# Patient Record
Sex: Female | Born: 1980 | Race: Black or African American | Hispanic: No | Marital: Married | Smoking: Current every day smoker
Health system: Southern US, Community
[De-identification: ages and names within clinical notes are randomized; demographics above are authoritative.]

## PROBLEM LIST (undated history)

## (undated) DIAGNOSIS — T8859XA Other complications of anesthesia, initial encounter: Secondary | ICD-10-CM

## (undated) DIAGNOSIS — M549 Dorsalgia, unspecified: Secondary | ICD-10-CM

## (undated) DIAGNOSIS — F329 Major depressive disorder, single episode, unspecified: Secondary | ICD-10-CM

## (undated) DIAGNOSIS — F4310 Post-traumatic stress disorder, unspecified: Secondary | ICD-10-CM

## (undated) DIAGNOSIS — Z5189 Encounter for other specified aftercare: Secondary | ICD-10-CM

## (undated) DIAGNOSIS — T4145XA Adverse effect of unspecified anesthetic, initial encounter: Secondary | ICD-10-CM

## (undated) DIAGNOSIS — F32A Depression, unspecified: Secondary | ICD-10-CM

## (undated) HISTORY — PX: TUBAL LIGATION: SHX77

## (undated) HISTORY — PX: ANTERIOR CRUCIATE LIGAMENT REPAIR: SHX115

## (undated) HISTORY — PX: TONSILLECTOMY: SUR1361

## (undated) HISTORY — PX: FOOT SURGERY: SHX648

## (undated) HISTORY — PX: CYSTECTOMY: SUR359

## (undated) HISTORY — PX: DILATION AND CURETTAGE OF UTERUS: SHX78

---

## 1999-08-15 ENCOUNTER — Other Ambulatory Visit: Admission: RE | Admit: 1999-08-15 | Discharge: 1999-08-15 | Payer: Self-pay | Admitting: Family Medicine

## 2000-04-28 ENCOUNTER — Encounter: Payer: Self-pay | Admitting: Family Medicine

## 2000-04-28 ENCOUNTER — Ambulatory Visit (HOSPITAL_COMMUNITY): Admission: RE | Admit: 2000-04-28 | Discharge: 2000-04-28 | Payer: Self-pay | Admitting: Family Medicine

## 2001-06-10 ENCOUNTER — Ambulatory Visit (HOSPITAL_BASED_OUTPATIENT_CLINIC_OR_DEPARTMENT_OTHER): Admission: RE | Admit: 2001-06-10 | Discharge: 2001-06-10 | Payer: Self-pay | Admitting: Otolaryngology

## 2005-12-01 DIAGNOSIS — Z5189 Encounter for other specified aftercare: Secondary | ICD-10-CM

## 2005-12-01 HISTORY — DX: Encounter for other specified aftercare: Z51.89

## 2012-02-29 ENCOUNTER — Emergency Department (HOSPITAL_BASED_OUTPATIENT_CLINIC_OR_DEPARTMENT_OTHER)
Admission: EM | Admit: 2012-02-29 | Discharge: 2012-02-29 | Disposition: A | Discharge status: Home / Self Care | Payer: Medicaid Other | Attending: Emergency Medicine | Admitting: Emergency Medicine

## 2012-02-29 ENCOUNTER — Emergency Department (INDEPENDENT_AMBULATORY_CARE_PROVIDER_SITE_OTHER): Payer: Medicaid Other

## 2012-02-29 ENCOUNTER — Encounter (HOSPITAL_BASED_OUTPATIENT_CLINIC_OR_DEPARTMENT_OTHER): Payer: Self-pay | Admitting: *Deleted

## 2012-02-29 DIAGNOSIS — R0789 Other chest pain: Secondary | ICD-10-CM

## 2012-02-29 DIAGNOSIS — S298XXA Other specified injuries of thorax, initial encounter: Secondary | ICD-10-CM | POA: Insufficient documentation

## 2012-02-29 DIAGNOSIS — R079 Chest pain, unspecified: Secondary | ICD-10-CM

## 2012-02-29 DIAGNOSIS — X58XXXA Exposure to other specified factors, initial encounter: Secondary | ICD-10-CM | POA: Insufficient documentation

## 2012-02-29 DIAGNOSIS — I10 Essential (primary) hypertension: Secondary | ICD-10-CM | POA: Insufficient documentation

## 2012-02-29 LAB — PREGNANCY, URINE: Preg Test, Ur: NEGATIVE

## 2012-02-29 MED ORDER — OXYCODONE-ACETAMINOPHEN 5-325 MG PO TABS: 1.0000 | ORAL_TABLET | Freq: Four times a day (QID) | ORAL | Status: AC | PRN

## 2012-02-29 MED ORDER — NAPROXEN 500 MG PO TABS: 500.0000 mg | ORAL_TABLET | Freq: Two times a day (BID) | ORAL | Status: AC

## 2012-02-29 MED ORDER — IBUPROFEN 800 MG PO TABS
800.0000 mg | ORAL_TABLET | Freq: Once | ORAL | Status: AC
  Administered 2012-02-29: 800 mg via ORAL
  Filled 2012-02-29: qty 1

## 2012-02-29 NOTE — ED Notes (Signed)
MD at bedside. 
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
## 2012-02-29 NOTE — Discharge Instructions (Signed)
Chest Wall Pain Chest wall pain is pain in or around the bones and muscles of your chest. It may take up to 6 weeks to get better. It may take longer if you must stay physically active in your work and activities.  CAUSES  Chest wall pain may happen on its own. However, it may be caused by:  A viral illness like the flu.   Injury.   Coughing.   Exercise.   Arthritis.   Fibromyalgia.   Shingles.  HOME CARE INSTRUCTIONS   Avoid overtiring physical activity. Try not to strain or perform activities that cause pain. This includes any activities using your chest or your abdominal and side muscles, especially if heavy weights are used.   Put ice on the sore area.   Put ice in a plastic bag.   Place a towel between your skin and the bag.   Leave the ice on for 15 to 20 minutes per hour while awake for the first 2 days.   Only take over-the-counter or prescription medicines for pain, discomfort, or fever as directed by your caregiver.  SEEK IMMEDIATE MEDICAL CARE IF:   Your pain increases, or you are very uncomfortable.   You have a fever.   Your chest pain becomes worse.   You have new, unexplained symptoms.   You have nausea or vomiting.   You feel sweaty or lightheaded.   You have a cough with phlegm (sputum), or you cough up blood.  MAKE SURE YOU:   Understand these instructions.   Will watch your condition.   Will get help right away if you are not doing well or get worse.  Document Released: 11/17/2005 Document Revised: 11/06/2011 Document Reviewed: 07/14/2011 Exit[employer] Patient Information 2012 [employer]. 
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
## 2012-12-01 NOTE — L&D Delivery Note (Signed)
Delivery Note  Patient progressed to complete dilation well.  At 11:26 PM a viable female was delivered via Vaginal, Spontaneous Delivery (Presentation: Middle Occiput Posterior).  APGAR: 8, 9; weight pending.   Placenta status: Intact, Spontaneous.  Cord: 3 vessels with the following complications: Short.  Cord pH: n/a  Anesthesia: Epidural  Episiotomy: None Lacerations: none Suture Repair: n/a Est. Blood Loss (mL): 250  Mom to postpartum.  Baby to nursery-stable.  Anna Owens 07/17/2013, 11:45 PM    I was present for the delivery and agree with above Owens,Anna Raisch 07/17/2013 11:51 PM

## 2013-01-24 LAB — OB RESULTS CONSOLE ABO/RH: RH Type: POSITIVE

## 2013-01-24 LAB — OB RESULTS CONSOLE HEPATITIS B SURFACE ANTIGEN: Hepatitis B Surface Ag: NEGATIVE

## 2013-01-24 LAB — OB RESULTS CONSOLE HIV ANTIBODY (ROUTINE TESTING): HIV: NONREACTIVE

## 2013-01-24 LAB — OB RESULTS CONSOLE RUBELLA ANTIBODY, IGM: Rubella: IMMUNE

## 2013-07-17 ENCOUNTER — Inpatient Hospital Stay (HOSPITAL_COMMUNITY): Payer: Medicaid Other | Admitting: Anesthesiology

## 2013-07-17 ENCOUNTER — Inpatient Hospital Stay (HOSPITAL_COMMUNITY)
Admission: AD | Admit: 2013-07-17 | Discharge: 2013-07-19 | DRG: 775 | Disposition: A | Discharge status: Home / Self Care | Payer: Medicaid Other | Source: Ambulatory Visit | Attending: Obstetrics & Gynecology | Admitting: Obstetrics & Gynecology

## 2013-07-17 ENCOUNTER — Encounter (HOSPITAL_COMMUNITY): Payer: Self-pay | Admitting: *Deleted

## 2013-07-17 ENCOUNTER — Encounter (HOSPITAL_COMMUNITY): Payer: Self-pay | Admitting: Anesthesiology

## 2013-07-17 DIAGNOSIS — O4290 Premature rupture of membranes, unspecified as to length of time between rupture and onset of labor, unspecified weeks of gestation: Secondary | ICD-10-CM

## 2013-07-17 DIAGNOSIS — O09529 Supervision of elderly multigravida, unspecified trimester: Secondary | ICD-10-CM | POA: Diagnosis present

## 2013-07-17 HISTORY — DX: Other complications of anesthesia, initial encounter: T88.59XA

## 2013-07-17 HISTORY — DX: Major depressive disorder, single episode, unspecified: F32.9

## 2013-07-17 HISTORY — DX: Adverse effect of unspecified anesthetic, initial encounter: T41.45XA

## 2013-07-17 HISTORY — DX: Depression, unspecified: F32.A

## 2013-07-17 HISTORY — DX: Encounter for other specified aftercare: Z51.89

## 2013-07-17 LAB — CBC
HCT: 35.1 % — ABNORMAL LOW (ref 36.0–46.0)
Hemoglobin: 11.2 g/dL — ABNORMAL LOW (ref 12.0–15.0)
MCH: 24.8 pg — ABNORMAL LOW (ref 26.0–34.0)
MCHC: 31.9 g/dL (ref 30.0–36.0)

## 2013-07-17 LAB — RPR: RPR Ser Ql: NONREACTIVE

## 2013-07-17 MED ORDER — ACETAMINOPHEN 325 MG PO TABS: 650.0000 mg | ORAL_TABLET | ORAL | Status: DC | PRN

## 2013-07-17 MED ORDER — EPHEDRINE 5 MG/ML INJ: 10.0000 mg | INTRAVENOUS | Status: DC | PRN

## 2013-07-17 MED ORDER — BENZOCAINE-MENTHOL 20-0.5 % EX AERO
1.0000 "application " | INHALATION_SPRAY | CUTANEOUS | Status: DC | PRN
  Administered 2013-07-18: 1 via TOPICAL
  Filled 2013-07-17 (×2): qty 56

## 2013-07-17 MED ORDER — FENTANYL CITRATE 0.05 MG/ML IJ SOLN
100.0000 ug | INTRAMUSCULAR | Status: DC | PRN
  Administered 2013-07-17: 100 ug via INTRAVENOUS
  Filled 2013-07-17: qty 2

## 2013-07-17 MED ORDER — OXYTOCIN 40 UNITS IN LACTATED RINGERS INFUSION - SIMPLE MED: 62.5000 mL/h | INTRAVENOUS | Status: DC

## 2013-07-17 MED ORDER — BISACODYL 10 MG RE SUPP
10.0000 mg | Freq: Every day | RECTAL | Status: DC | PRN
  Filled 2013-07-17: qty 1

## 2013-07-17 MED ORDER — DIBUCAINE 1 % RE OINT
1.0000 "application " | TOPICAL_OINTMENT | RECTAL | Status: DC | PRN
  Filled 2013-07-17: qty 28

## 2013-07-17 MED ORDER — FLEET ENEMA 7-19 GM/118ML RE ENEM: 1.0000 | ENEMA | RECTAL | Status: DC | PRN

## 2013-07-17 MED ORDER — OXYCODONE-ACETAMINOPHEN 5-325 MG PO TABS
1.0000 | ORAL_TABLET | ORAL | Status: DC | PRN
  Administered 2013-07-18 – 2013-07-19 (×4): 1 via ORAL
  Filled 2013-07-17 (×4): qty 1

## 2013-07-17 MED ORDER — OXYTOCIN 40 UNITS IN LACTATED RINGERS INFUSION - SIMPLE MED: 62.5000 mL/h | INTRAVENOUS | Status: DC | PRN

## 2013-07-17 MED ORDER — MEASLES, MUMPS & RUBELLA VAC ~~LOC~~ INJ: 0.5000 mL | INJECTION | Freq: Once | SUBCUTANEOUS | Status: DC

## 2013-07-17 MED ORDER — PHENYLEPHRINE 40 MCG/ML (10ML) SYRINGE FOR IV PUSH (FOR BLOOD PRESSURE SUPPORT): 80.0000 ug | PREFILLED_SYRINGE | INTRAVENOUS | Status: DC | PRN

## 2013-07-17 MED ORDER — OXYCODONE-ACETAMINOPHEN 5-325 MG PO TABS: 1.0000 | ORAL_TABLET | ORAL | Status: DC | PRN

## 2013-07-17 MED ORDER — DIPHENHYDRAMINE HCL 50 MG/ML IJ SOLN: 12.5000 mg | INTRAMUSCULAR | Status: DC | PRN

## 2013-07-17 MED ORDER — TERBUTALINE SULFATE 1 MG/ML IJ SOLN: 0.2500 mg | Freq: Once | INTRAMUSCULAR | Status: DC | PRN

## 2013-07-17 MED ORDER — PHENYLEPHRINE 40 MCG/ML (10ML) SYRINGE FOR IV PUSH (FOR BLOOD PRESSURE SUPPORT)
80.0000 ug | PREFILLED_SYRINGE | INTRAVENOUS | Status: DC | PRN
  Filled 2013-07-17: qty 5

## 2013-07-17 MED ORDER — IBUPROFEN 600 MG PO TABS: 600.0000 mg | ORAL_TABLET | Freq: Four times a day (QID) | ORAL | Status: DC | PRN

## 2013-07-17 MED ORDER — ONDANSETRON HCL 4 MG/2ML IJ SOLN: 4.0000 mg | INTRAMUSCULAR | Status: DC | PRN

## 2013-07-17 MED ORDER — CITRIC ACID-SODIUM CITRATE 334-500 MG/5ML PO SOLN
30.0000 mL | ORAL | Status: DC | PRN
  Administered 2013-07-17 (×2): 30 mL via ORAL
  Filled 2013-07-17 (×2): qty 15

## 2013-07-17 MED ORDER — OXYTOCIN 40 UNITS IN LACTATED RINGERS INFUSION - SIMPLE MED
1.0000 m[IU]/min | INTRAVENOUS | Status: DC
  Administered 2013-07-17: 2 m[IU]/min via INTRAVENOUS
  Filled 2013-07-17: qty 1000

## 2013-07-17 MED ORDER — LACTATED RINGERS IV SOLN
500.0000 mL | Freq: Once | INTRAVENOUS | Status: AC
  Administered 2013-07-17: 500 mL via INTRAVENOUS

## 2013-07-17 MED ORDER — FENTANYL 2.5 MCG/ML BUPIVACAINE 1/10 % EPIDURAL INFUSION (WH - ANES)
14.0000 mL/h | INTRAMUSCULAR | Status: DC | PRN
  Administered 2013-07-17: 14 mL/h via EPIDURAL
  Filled 2013-07-17 (×2): qty 125

## 2013-07-17 MED ORDER — ZOLPIDEM TARTRATE 5 MG PO TABS: 5.0000 mg | ORAL_TABLET | Freq: Every evening | ORAL | Status: DC | PRN

## 2013-07-17 MED ORDER — SENNOSIDES-DOCUSATE SODIUM 8.6-50 MG PO TABS
2.0000 | ORAL_TABLET | Freq: Every day | ORAL | Status: DC
  Administered 2013-07-18: 2 via ORAL

## 2013-07-17 MED ORDER — OXYTOCIN BOLUS FROM INFUSION: 500.0000 mL | INTRAVENOUS | Status: DC

## 2013-07-17 MED ORDER — IBUPROFEN 600 MG PO TABS
600.0000 mg | ORAL_TABLET | Freq: Four times a day (QID) | ORAL | Status: DC
  Administered 2013-07-18 – 2013-07-19 (×7): 600 mg via ORAL
  Filled 2013-07-17 (×7): qty 1

## 2013-07-17 MED ORDER — EPHEDRINE 5 MG/ML INJ
10.0000 mg | INTRAVENOUS | Status: DC | PRN
  Filled 2013-07-17: qty 4

## 2013-07-17 MED ORDER — LIDOCAINE HCL (PF) 1 % IJ SOLN
30.0000 mL | INTRAMUSCULAR | Status: DC | PRN
  Filled 2013-07-17: qty 30

## 2013-07-17 MED ORDER — FERROUS SULFATE 325 (65 FE) MG PO TABS
325.0000 mg | ORAL_TABLET | Freq: Two times a day (BID) | ORAL | Status: DC
  Administered 2013-07-18 – 2013-07-19 (×4): 325 mg via ORAL
  Filled 2013-07-17 (×5): qty 1

## 2013-07-17 MED ORDER — SIMETHICONE 80 MG PO CHEW: 80.0000 mg | CHEWABLE_TABLET | ORAL | Status: DC | PRN

## 2013-07-17 MED ORDER — PRENATAL MULTIVITAMIN CH
1.0000 | ORAL_TABLET | Freq: Every day | ORAL | Status: DC
  Administered 2013-07-18 – 2013-07-19 (×2): 1 via ORAL
  Filled 2013-07-17 (×2): qty 1

## 2013-07-17 MED ORDER — LIDOCAINE HCL (PF) 1 % IJ SOLN
INTRAMUSCULAR | Status: DC | PRN
  Administered 2013-07-17 (×2): 3 mL

## 2013-07-17 MED ORDER — ONDANSETRON HCL 4 MG/2ML IJ SOLN
4.0000 mg | Freq: Four times a day (QID) | INTRAMUSCULAR | Status: DC | PRN
  Administered 2013-07-17: 4 mg via INTRAVENOUS
  Filled 2013-07-17: qty 2

## 2013-07-17 MED ORDER — LACTATED RINGERS IV SOLN
INTRAVENOUS | Status: DC
  Administered 2013-07-17 (×3): via INTRAVENOUS

## 2013-07-17 MED ORDER — LACTATED RINGERS IV SOLN
500.0000 mL | INTRAVENOUS | Status: DC | PRN
  Administered 2013-07-17: 500 mL via INTRAVENOUS

## 2013-07-17 MED ORDER — DIPHENHYDRAMINE HCL 25 MG PO CAPS: 25.0000 mg | ORAL_CAPSULE | Freq: Four times a day (QID) | ORAL | Status: DC | PRN

## 2013-07-17 MED ORDER — WITCH HAZEL-GLYCERIN EX PADS: 1.0000 "application " | MEDICATED_PAD | CUTANEOUS | Status: DC | PRN

## 2013-07-17 MED ORDER — HYDROXYZINE HCL 50 MG PO TABS: 50.0000 mg | ORAL_TABLET | Freq: Four times a day (QID) | ORAL | Status: DC | PRN

## 2013-07-17 MED ORDER — LANOLIN HYDROUS EX OINT: TOPICAL_OINTMENT | CUTANEOUS | Status: DC | PRN

## 2013-07-17 MED ORDER — FLEET ENEMA 7-19 GM/118ML RE ENEM: 1.0000 | ENEMA | Freq: Every day | RECTAL | Status: DC | PRN

## 2013-07-17 MED ORDER — METHYLERGONOVINE MALEATE 0.2 MG PO TABS: 0.2000 mg | ORAL_TABLET | ORAL | Status: DC | PRN

## 2013-07-17 MED ORDER — METHYLERGONOVINE MALEATE 0.2 MG/ML IJ SOLN: 0.2000 mg | INTRAMUSCULAR | Status: DC | PRN

## 2013-07-17 MED ORDER — OXYTOCIN BOLUS FROM INFUSION
500.0000 mL | INTRAVENOUS | Status: DC
  Administered 2013-07-17: 500 mL via INTRAVENOUS

## 2013-07-17 MED ORDER — FENTANYL 2.5 MCG/ML BUPIVACAINE 1/10 % EPIDURAL INFUSION (WH - ANES)
INTRAMUSCULAR | Status: DC | PRN
  Administered 2013-07-17: 14 mL/h via EPIDURAL

## 2013-07-17 MED ORDER — TETANUS-DIPHTH-ACELL PERTUSSIS 5-2.5-18.5 LF-MCG/0.5 IM SUSP: 0.5000 mL | Freq: Once | INTRAMUSCULAR | Status: DC

## 2013-07-17 MED ORDER — ONDANSETRON HCL 4 MG PO TABS: 4.0000 mg | ORAL_TABLET | ORAL | Status: DC | PRN

## 2013-07-17 NOTE — Anesthesia Preprocedure Evaluation (Addendum)
Anesthesia Evaluation  Patient identified by MRN, date of birth, ID band Patient awake    Reviewed: Allergy & Precautions, H&P , NPO status , Patient's Chart, lab work & pertinent test results  History of Anesthesia Complications Negative for: history of anesthetic complications  Airway Mallampati: II TM Distance: >3 FB Neck ROM: Full    Dental   Pulmonary neg pulmonary ROS,  breath sounds clear to auscultation        Cardiovascular negative cardio ROS  Rhythm:Regular     Neuro/Psych PSYCHIATRIC DISORDERS Depression negative neurological ROS     GI/Hepatic negative GI ROS, Neg liver ROS,   Endo/Other  negative endocrine ROS  Renal/GU negative Renal ROS     Musculoskeletal negative musculoskeletal ROS (+)   Abdominal   Peds  Hematology negative hematology ROS (+)   Anesthesia Other Findings   Reproductive/Obstetrics (+) Pregnancy                          Anesthesia Physical Anesthesia Plan  ASA: II  Anesthesia Plan: Epidural   Post-op Pain Management:    Induction:   Airway Management Planned:   Additional Equipment:   Intra-op Plan:   Post-operative Plan:   Informed Consent: I have reviewed the patients History and Physical, chart, labs and discussed the procedure including the risks, benefits and alternatives for the proposed anesthesia with the patient or authorized representative who has indicated his/her understanding and acceptance.     Plan Discussed with:   Anesthesia Plan Comments:         Anesthesia Quick Evaluation

## 2013-07-17 NOTE — Progress Notes (Signed)
Patient ID: Anna Owens, female   DOB: 1981-06-12, 32 y.o.   MRN: [id number] Anna Owens is a 32 y.o. [id number] at [redacted]w[redacted]d  admitted for rupture of membranes  Subjective:  Pt hurting and now s/p epidural. Now more comfortable. +FM.    Objective: BP 114/73  Pulse 71  Temp(Src) 98.1 F (36.7 C) (Oral)  Resp 18  Ht 5\' 5"  (1.651 m)  Wt 74.844 kg (165 lb)  BMI 27.46 kg/m2  SpO2 100%      FHT:  FHR: 130 bpm, variability: moderate,  accelerations:  Present,  decelerations:  Absent UC:   irregular, every 2-5 minutes SVE:   Dilation: 4 Effacement (%): 90 Station: -1 Exam by:: Gazella Anglin  Labs: Lab Results  Component Value Date   WBC 6.5 07/17/2013   HGB 11.2* 07/17/2013   HCT 35.1* 07/17/2013   MCV 77.8* 07/17/2013   PLT 244 07/17/2013    Assessment / Plan: admitted for SROM and now augmentation with pitocin  Labor: progressing well. now on 35mu/min. No change in SVE but irregular contractions and now hurting more. Will plan to cont increasing pit and recheck in 2 hours. If no change, will place IUPC. Fetal Wellbeing:  Category I Pain Control:  Epidural I/D:  n/a Anticipated MOD:  NSVD  Charrise Lardner L 07/17/2013, 1:31 PM

## 2013-07-17 NOTE — Progress Notes (Signed)
Patient ID: Anna Owens, female   DOB: 03-Nov-1981, 32 y.o.   MRN: [id number] Patient ID: Anna Owens, female   DOB: 03-25-1981, 32 y.o.   MRN: [id number] Anna Owens is a 32 y.o. [id number] at [redacted]w[redacted]d  admitted for rupture of membranes  Subjective:  Pt comfortable with epidural. . +FM.    Objective: BP 102/65  Pulse 84  Temp(Src) 97.8 F (36.6 C) (Oral)  Resp 18  Ht 5\' 5"  (1.651 m)  Wt 74.844 kg (165 lb)  BMI 27.46 kg/m2  SpO2 100%      FHT:  FHR: 140 bpm, variability: moderate,  accelerations:  Present,  decelerations:  Absent UC:   irregular, every 2-5 minutes SVE:   Dilation: 5.5 Effacement (%): 90 Station: -1 Exam by:: Kaushal Vannice  Labs: Lab Results  Component Value Date   WBC 6.5 07/17/2013   HGB 11.2* 07/17/2013   HCT 35.1* 07/17/2013   MCV 77.8* 07/17/2013   PLT 244 07/17/2013    Assessment / Plan: admitted for SROM and now augmentation with pitocin  Labor: progressing well. was on 14 mu/min and then stopped due to cat II tracing with variable lates. SVE now changed. Will restart pit also now that cat I tracing.   Fetal Wellbeing:  Category I and cat II earlier Pain Control:  Epidural I/D:  n/a Anticipated MOD:  NSVD  Anna Owens 07/17/2013, 4:05 PM

## 2013-07-17 NOTE — H&P (Signed)
Anna Owens is a 33 y.o. [phone] female at [redacted]w[redacted]d presenting for report of LOF since 39.  Reports good fm, denies vb or uc's. Prenatal care in [facility], states she thought she could just come here for delivery. Unsure if she had genetic screening, anatomy u/s and glucola normal per pt and states gbs neg. Denies complications during pregnancy.  H/O uncomplicated term SVD x 2 in [place], both 6+lbs.   Maternal Medical History:  Reason for admission: Rupture of membranes.   Contractions: Doesn't perceive uc's  Fetal activity: Perceived fetal activity is normal.   Last perceived fetal movement was within the past hour.    Prenatal complications: no prenatal complications Per pt  Prenatal Complications - Diabetes: none.    OB History   Grav Para Term Preterm Abortions TAB SAB Ect Mult Living   9 2 1 1 6 3 3   2      Past Medical History  Diagnosis Date  . Depression     no meds  . Blood transfusion without reported diagnosis 2007    SAB  . Complication of anesthesia     "difficult waking up"   Past Surgical History  Procedure Laterality Date  . Foot surgery    . Tonsillectomy    . Dilation and curettage of uterus    . Cystectomy     Family History: family history is not on file. Social History:  reports that she has never smoked. She does not have any smokeless tobacco history on file. She reports that she does not drink alcohol or use illicit drugs.   Review of Systems  Constitutional: Negative.   HENT: Negative.   Eyes: Negative.   Respiratory: Negative.   Cardiovascular: Negative.   Gastrointestinal: Negative.   Genitourinary: Negative.   Musculoskeletal: Negative.   Skin: Negative.   Neurological: Negative.   Endo/Heme/Allergies: Negative.   Psychiatric/Behavioral: Negative.     Dilation: 3 Effacement (%): 70 Station: -2 Exam by:: Joellyn Haff CNM Blood pressure 116/72, pulse 107, temperature 98 F (36.7 C), temperature source Oral, resp. rate 20,  height 5\' 5"  (1.651 m), weight 74.844 kg (165 lb). Maternal Exam:  Uterine Assessment: Contraction strength is mild.  Contraction frequency is regular.   Abdomen: Fetal presentation: vertex  Introitus: Normal vulva. Normal vagina.  Ferning test: not done.  Amniotic fluid character: clear. Grossly ruptured  Pelvis: adequate for delivery.   Cervix: Cervix evaluated by digital exam.     Fetal Exam Fetal Monitor Review: Mode: ultrasound.   Baseline rate: 140.  Variability: moderate (6-25 bpm).   Pattern: accelerations present and no decelerations.    Fetal State Assessment: Category I - tracings are normal.     Physical Exam  Constitutional: She is oriented to person, place, and time. She appears well-developed and well-nourished.  HENT:  Head: Normocephalic.  Neck: Normal range of motion.  Cardiovascular: Normal rate and regular rhythm.   Respiratory: Effort normal and breath sounds normal.  GI: Soft. There is no tenderness.  gravid  Genitourinary:  Grossly ruptured, clear fluid SVE: 3/70/-2, vtx  Musculoskeletal: Normal range of motion.  Neurological: She is alert and oriented to person, place, and time. She has normal reflexes.  Skin: Skin is warm and dry.  Psychiatric: She has a normal mood and affect. Her behavior is normal. Judgment and thought content normal.    Prenatal labs: waiting on prenatal records to be faxed ABO, Rh:   Antibody:   Rubella:   RPR:  HBsAg:    HIV:    GBS:     Assessment/Plan: A:  [redacted]w[redacted]d SIUP  [id number]   PROM @ 1900  PNC in [place]  GBS neg per pt  Cat I FHR  P:  Admit to BS  IV pain meds/epidural prn  Expectant management   Anticipate NSVD  Awaiting prenatal records from [place]     Marge Duncans 07/17/2013, 4:35 AM

## 2013-07-17 NOTE — Progress Notes (Signed)
Anna Owens is a 32 y.o. [phone] at [redacted]w[redacted]d  admitted for rupture of membranes  Subjective:  Pt feeling contractions over the last hour or so. +FM.    Objective: BP 125/79  Pulse 72  Temp(Src) 98.1 F (36.7 C) (Oral)  Resp 18  Ht 5\' 5"  (1.651 m)  Wt 74.844 kg (165 lb)  BMI 27.46 kg/m2      FHT:  FHR: 135 bpm, variability: periods of minimal with periods of moderate,  accelerations:  Present,  decelerations:  Absent UC:   regular, every 2-4 minutes SVE:   Dilation: 4 Effacement (%): 90 Station: -2 Exam by:: Nabeeha Badertscher  Labs: Lab Results  Component Value Date   WBC 6.5 07/17/2013   HGB 11.2* 07/17/2013   HCT 35.1* 07/17/2013   MCV 77.8* 07/17/2013   PLT 244 07/17/2013    Assessment / Plan: admitted for SROM and now augmentation with pitocin  Labor: progressing well. now on 48mu/min Fetal Wellbeing:  cat I with periods of cat II Pain Control:  Labor support without medications I/D:  n/a Anticipated MOD:  NSVD  Mahek Schlesinger L 07/17/2013, 11:09 AM

## 2013-07-17 NOTE — Progress Notes (Signed)
KRISHNA HEUER is a 32 y.o. [phone] at [redacted]w[redacted]d admitted for rupture of membranes  Subjective: Comfortable, no complaints  Objective: BP 126/77  Pulse 85  Temp(Src) 98.7 F (37.1 C) (Oral)  Resp 18  Ht 5\' 5"  (1.651 m)  Wt 74.844 kg (165 lb)  BMI 27.46 kg/m2      FHT:  FHR: 145 bpm, variability: moderate,  accelerations:  Present,  decelerations:  Absent UC:   Mild, irregular, not perceived by pt SVE:   Dilation: 3 Effacement (%): 70 Station: -2 Exam by:: [employer]: Lab Results  Component Value Date   WBC 6.5 07/17/2013   HGB 11.2* 07/17/2013   HCT 35.1* 07/17/2013   MCV 77.8* 07/17/2013   PLT 244 07/17/2013    Assessment / Plan: PROM x 12hrs w/o cervical change, will begin pitocin per protocol  Labor: n/a Preeclampsia:  n/a Fetal Wellbeing:  Category I Pain Control:  n/a I/D:  n/a Anticipated MOD:  NSVD  Marge Duncans 07/17/2013, 8:41 AM

## 2013-07-17 NOTE — Anesthesia Procedure Notes (Signed)
Epidural Patient location during procedure: OB Start time: 07/17/2013 12:25 PM End time: 07/17/2013 12:40 PM  Staffing Anesthesiologist: Lewie Loron R Performed by: anesthesiologist   Preanesthetic Checklist Completed: patient identified, pre-op evaluation, timeout performed, IV checked, risks and benefits discussed and monitors and equipment checked  Epidural Patient position: sitting Prep: site prepped and draped and DuraPrep Patient monitoring: heart rate, continuous pulse ox and blood pressure Approach: midline Injection technique: LOR air and LOR saline  Needle:  Needle type: Tuohy  Needle gauge: 17 G Needle length: 9 cm Needle insertion depth: 6 cm Catheter type: closed end flexible Catheter size: 19 Gauge Catheter at skin depth: 12 cm Test dose: negative  Assessment Sensory level: T8 Events: blood not aspirated, injection not painful, no injection resistance, negative IV test and no paresthesia  Additional Notes Reason for block:procedure for pain

## 2013-07-17 NOTE — Progress Notes (Signed)
Patient ID: Anna Owens, female   DOB: 1981-05-28, 32 y.o.   MRN: [id number] Anna Owens is a 32 y.o. [id number] at [redacted]w[redacted]d  admitted for rupture of membranes  Subjective:  Pt comfortable with epidural. Not feeling many contractions.  Objective: BP 118/65  Pulse 80  Temp(Src) 98.2 F (36.8 C) (Oral)  Resp 18  Ht 5\' 5"  (1.651 m)  Wt 165 lb (74.844 kg)  BMI 27.46 kg/m2  SpO2 100%      FHR: baseline 140, moderate variability, + accels, rare mild variable decels UC:   regular, every 3 minutes SVE:   Dilation: 9 Effacement (%): 100 Station: +1 Exam by:: Cresenzo-Dishmon CNM  Labs: Lab Results  Component Value Date   WBC 6.5 07/17/2013   HGB 11.2* 07/17/2013   HCT 35.1* 07/17/2013   MCV 77.8* 07/17/2013   PLT 244 07/17/2013    Assessment / Plan: admitted for SROM and now augmentation with pitocin  Labor: progressing well.  Fetal Wellbeing:  Category II Pain Control:  Epidural I/D:  n/a Anticipated MOD:  NSVD  Levert Feinstein 07/17/2013, 9:13 PM

## 2013-07-17 NOTE — MAU Note (Addendum)
PT SAYS SHE GETS PNC IN [place]  WITH [facility].     WAS SEEN IN OFFICE LAST Thursday.   DENIES ANY COMPLICATIONS WITH PREG.   SAYS GBS IS NEG.    NEXT APPOINTMENT  IS WED.    SAYS  SROM AT 930PM-    SHE WAS SITTING ON BED AND SHE FELT PANTS GET WET.  STILL FEELS FLUID COMING OUT.    VE IN OFFICE 1 CM.    DENIES HSV AND MRSA.  PT SAYS SHE CALLED HERE  AT 0230  - AND NURSE TOLD HER TO COME IN.

## 2013-07-18 ENCOUNTER — Encounter (HOSPITAL_COMMUNITY): Payer: Self-pay | Admitting: *Deleted

## 2013-07-18 NOTE — Progress Notes (Signed)
Post Partum Day 1 Subjective: no complaints, up ad lib, voiding and tolerating PO.  Pain well controlled, although complains of recurrent hip pain and needs a walker to the bathroom.  Bleeding significantly decreased.  Objective: Blood pressure 97/57, pulse 86, temperature 98.4 F (36.9 C), temperature source Oral, resp. rate 18, height 5\' 5"  (1.651 m), weight 74.844 kg (165 lb), SpO2 100.00%, unknown if currently breastfeeding.  Physical Exam:  General: alert, cooperative and no distress Lochia: appropriate Uterine Fundus: firm Incision: NA DVT Evaluation: No evidence of DVT seen on physical exam. Negative Homan's sign. No cords or calf tenderness. No significant calf/ankle edema.   Recent Labs  07/17/13 0445  HGB 11.2*  HCT 35.1*    Assessment/Plan: Plan for discharge tomorrow, Breastfeeding and Contraception Mirena.   LOS: 1 day   Garnette Czech 07/18/2013, 8:13 AM   I have seen and examined this patient and agree with above documentation in the PA student's note.   Rulon Abide, M.D. [facility] Fellow 07/18/2013 9:11 AM

## 2013-07-18 NOTE — Anesthesia Postprocedure Evaluation (Signed)
  Anesthesia Post-op Note  Patient: Anna Owens  Procedure(s) Performed: * No procedures listed *  Patient Location: Mother/Baby  Anesthesia Type:Epidural  Level of Consciousness: awake  Airway and Oxygen Therapy: Patient Spontanous Breathing  Post-op Pain: none  Post-op Assessment: Patient's Cardiovascular Status Stable, Respiratory Function Stable, Patent Airway, No signs of Nausea or vomiting, Adequate PO intake, Pain level controlled, No headache, No backache, No residual numbness and No residual motor weakness  Post-op Vital Signs: Reviewed and stable  Complications: No apparent anesthesia complications
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
## 2013-07-18 NOTE — Progress Notes (Signed)
Ur chart review completed.  

## 2013-07-19 MED ORDER — IBUPROFEN 600 MG PO TABS: 600.0000 mg | ORAL_TABLET | Freq: Four times a day (QID) | ORAL | Status: DC | PRN

## 2013-07-19 MED ORDER — PRENATAL PLUS 27-1 MG PO TABS: 1.0000 | ORAL_TABLET | Freq: Every day | ORAL | Status: DC

## 2013-07-19 NOTE — Evaluation (Signed)
Physical Therapy Evaluation Patient Details Name: Anna Owens MRN: [id number] DOB: July 23, 1981 Today's Date: 07/19/2013 Time: 4098-1191 PT Time Calculation (min): 25 min  PT Assessment / Plan / Recommendation History of Present Illness  32 yo s/p vaginal delivery.  Now with "hip pain".  Patient with h/o similar hip pain after previous delivery.  Recieved PT for separated pubic symphysis.    Clinical Impression  Patient presents with s/s compatible with pubic symphysis separation after vaginal delivery.  Patient with h/o pubic symphysis separation after last delivery.  Patient reports she is able to walk slowly but safely and has family support to assist with baby at discharge.  Instructed patient in pelvic strengthening exercise to encourage pubic symphysis closure.  Also discussed using a pelvic belt to assist (will ask MD to order a lumbar support to provide compression for pelvic area).  Ideally, patient would benefit from OP PT, however Medicaid will not cover further therapy at this time.  Discussed with patient if pain not better by follow up MD appt, she might benefit from OP PT visit to further assess and progress exercises.    PT Assessment  All further PT needs can be met in the next venue of care    Follow Up Recommendations  Outpatient PT          Equipment Recommendations  None recommended by PT          Precautions / Restrictions Precautions Precautions: None Restrictions Weight Bearing Restrictions: No   Pertinent Vitals/Pain Patient reports mild pain at this time (2/10) at rest in bed         Exercises Other Exercises Other Exercises: pelvic floor exercises - discuss performing kegels once vaginal pain decreases from delivery.  Instructed patient in transverse abdominal isometric and glut sets to activate pelvic floor and strengthen pelvic muscles. Other Exercises: resisted hip abduction x 5 reps.  Instructed patient to have significant other provide gentle  resistance.   PT Diagnosis: Difficulty walking;Acute pain  PT Problem List: Pain;Decreased strength;Decreased activity tolerance PT Treatment Interventions:       PT Goals(Current goals can be found in the care plan section) Acute Rehab PT Goals Patient Stated Goal: be able to move around PT Goal Formulation: No goals set, d/c therapy  Visit Information  Last PT Received On: 07/19/13 Assistance Needed: +1 History of Present Illness: 32 yo s/p vaginal delivery.  Now with "hip pain".  Patient with h/o similar hip pain after previous delivery.  Recieved PT for separated pubic symphysis.         Prior Functioning  Home Living Family/patient expects to be discharged to:: Private residence Living Arrangements: Children;Spouse/significant other Available Help at Discharge: Friend(s);Family Home Equipment: None Prior Function Level of Independence: Independent Communication Communication: No difficulties    Cognition  Cognition Arousal/Alertness: Awake/alert Behavior During Therapy: WFL for tasks assessed/performed Overall Cognitive Status: Within Functional Limits for tasks assessed    Extremity/Trunk Assessment Upper Extremity Assessment Upper Extremity Assessment: Overall WFL for tasks assessed Lower Extremity Assessment Lower Extremity Assessment: RLE deficits/detail;LLE deficits/detail RLE: Unable to fully assess due to pain LLE: Unable to fully assess due to pain   Balance    End of Session PT - End of Session Activity Tolerance: Patient tolerated treatment well Patient left: in bed;with call bell/phone within reach;with family/visitor present Nurse Communication: Other (comment) (results of session)  GP     Olivia Canter, [place] [phone] 07/19/2013, 4:49 PM

## 2013-07-19 NOTE — Progress Notes (Signed)
Post Partum Day 2 Subjective: no complaints, up ad lib, voiding, tolerating PO and + flatus.  Bleeding is doing well. Still having problems with ambulation. Did PT after prior delivery to help with hip pain.  Objective: Blood pressure 108/65, pulse 81, temperature 98.2 F (36.8 C), temperature source Oral, resp. rate 17, height 5\' 5"  (1.651 m), weight 165 lb (74.844 kg), SpO2 100.00%, unknown if currently breastfeeding.  Physical Exam:  General: alert, cooperative and no distress Lochia: appropriate Uterine Fundus: firm Incision: NA DVT Evaluation: No evidence of DVT seen on physical exam. Negative Homan's sign. No cords or calf tenderness. No significant calf/ankle edema.   Recent Labs  07/17/13 0445  HGB 11.2*  HCT 35.1*    Assessment/Plan: Breastfeeding and Contraception Mirena. Likely discharge today vs tomorrow (will be 48 hours postpartum tonight around 11:30pm)  # Difficulty w/ ambulation: -Consider PT evaluation today. -Pt states her sister and boyfriend will help her care for the baby at home if she isn't able to walk around well. -Will touch base with peds team so they are aware of ambulation problems.    LOS: 2 days   Latrelle Dodrill, MD  07/19/2013, 7:12 AM    I have seen and examined this patient and agree with above documentation in the resident's note. Pt having difficulty ambulating. Will get PT consult while in house for planning and d/c to home tomorrow.   Rulon Abide, M.D. [facility] Fellow 07/19/2013 9:06 AM

## 2013-07-19 NOTE — Discharge Summary (Signed)
Obstetric Discharge Summary Reason for Admission: rupture of membranes at [redacted]w[redacted]d  32 yo [id number] Prenatal Procedures: ultrasound Intrapartum Procedures: spontaneous vaginal delivery Postpartum Procedures: PT consult due to difficulty ambulating d/t hip pain Complications-Operative and Postpartum: none Hemoglobin  Date Value Range Status  07/17/2013 11.2* 12.0 - 15.0 g/dL Final     HCT  Date Value Range Status  07/17/2013 35.1* 36.0 - 46.0 % Final    Physical Exam:  General: alert, cooperative and no distress Lochia: appropriate Uterine Fundus: firm Incision: n/a DVT Evaluation: No evidence of DVT seen on physical exam.  Discharge Diagnoses: Preterm NSVD  Discharge Information: Date: 07/19/2013 Activity: pelvic rest Diet: routine Medications: PNV and Ibuprofen Condition: stable Instructions: refer to practice specific booklet Discharge to: home and rooming in Follow-up Information   Follow up with [facility]. Schedule an appointment as soon as possible for a visit in 6 weeks.   Contact information:   [address] [place] [address] [phone]      Newborn Data: Live born female  Birth Weight: 5 lb 12.1 oz (2610 g) APGAR: 8, 9  Home with mother expected tomorrow.  Anna Owens 07/19/2013, 5:41 PM

## 2013-07-19 NOTE — Discharge Summary (Signed)
Attestation of Attending Supervision of Advanced Practitioner (PA/CNM/NP): Evaluation and management procedures were performed by the Advanced Practitioner under my supervision and collaboration.  I have reviewed the Advanced Practitioner's note and chart, and I agree with the management and plan.  Patient will follow up with PT on an outpatient basis; no need for acute intervention.  Jaynie Collins, MD, FACOG Attending Obstetrician & Gynecologist Faculty Practice, [facility]

## 2014-10-02 ENCOUNTER — Encounter (HOSPITAL_COMMUNITY): Payer: Self-pay | Admitting: *Deleted

## 2014-10-19 ENCOUNTER — Emergency Department (HOSPITAL_BASED_OUTPATIENT_CLINIC_OR_DEPARTMENT_OTHER)
Admission: EM | Admit: 2014-10-19 | Discharge: 2014-10-19 | Disposition: A | Discharge status: Home / Self Care | Payer: Medicaid Other | Attending: Emergency Medicine | Admitting: Emergency Medicine

## 2014-10-19 ENCOUNTER — Encounter (HOSPITAL_BASED_OUTPATIENT_CLINIC_OR_DEPARTMENT_OTHER): Payer: Self-pay | Admitting: Emergency Medicine

## 2014-10-19 ENCOUNTER — Emergency Department (HOSPITAL_BASED_OUTPATIENT_CLINIC_OR_DEPARTMENT_OTHER): Payer: Medicaid Other

## 2014-10-19 DIAGNOSIS — R200 Anesthesia of skin: Secondary | ICD-10-CM | POA: Diagnosis not present

## 2014-10-19 DIAGNOSIS — Z8781 Personal history of (healed) traumatic fracture: Secondary | ICD-10-CM | POA: Diagnosis not present

## 2014-10-19 DIAGNOSIS — M25531 Pain in right wrist: Secondary | ICD-10-CM | POA: Diagnosis present

## 2014-10-19 DIAGNOSIS — Z8659 Personal history of other mental and behavioral disorders: Secondary | ICD-10-CM | POA: Insufficient documentation

## 2014-10-19 DIAGNOSIS — Z87828 Personal history of other (healed) physical injury and trauma: Secondary | ICD-10-CM | POA: Insufficient documentation

## 2014-10-19 DIAGNOSIS — Z79899 Other long term (current) drug therapy: Secondary | ICD-10-CM | POA: Diagnosis not present

## 2014-10-19 MED ORDER — MELOXICAM 7.5 MG PO TABS: 7.5000 mg | ORAL_TABLET | Freq: Every day | ORAL | Status: DC

## 2014-10-19 NOTE — Discharge Instructions (Signed)
Wrist Dislocation, Lunate The lunate wrist dislocation is an injury to the one of the wrist bones (lunate). The lunate bone becomes displaced from its normal position. Because of this the joint surface no longer touches the joint surface of the bone the lunate is suppose to align with. A lesser, but similar, injury is known as a lunate subluxation. In this injury the lunate is displaced but the joint surfaces still touch. In order for the lunate to be either dislocated or subluxed, there must also be a tear (sprain) in the ligament that holds it in place. SYMPTOMS   Severe pain at the time of injury and when attempting to move the hand and wrist.  Loss of hand and/or wrist function.  Tenderness, obvious deformity, swelling, and bruising at the injury site.  Numbness or paralysis below the dislocation from pinching, cutting, or pressure on the blood vessels or nerves. CAUSES   Direct trauma to the wrist. For example, falling on an outstretched hand.  The end result of a severe wrist sprain.  You are born with it. This is called congenital abnormality. For example, a shallow or malformed joint surface makes the joint more susceptible to dislocation. RISK INCREASES WITH:   Playing sports in which falling or stress on the arm and hand is possible (football, basketball, soccer or volleyball).  Previous wrist sprain or dislocation.  Repeated injury to any bone or joint in the wrist.  Poor hand and wrist strength and flexibility. PREVENTION   Maintain physical fitness:  Cardiovascular fitness.  Wrist strength.  Flexibility and endurance.  Wear preventive taping, bandages, bracing, or wrist guards when participating in sports. PROGNOSIS  A lunate dislocation requires immediate realigning of the joint (reduction). After reduction, wrist movement will be restricted to allow for healing. Joint movement will be restricted for at least 6 weeks. If the lunate cannot be reduced manually or  there is some other complication, then surgery may be necessary. Wrist stiffness is very likely, even after healing and rehabilitation.  RELATED COMPLICATIONS   Damage to nearby nerves or major blood vessels in the area.  Fracture or joint cartilage injury.  Prolonged healing or recurrent dislocation if activity is resumed too soon.  Death of bone cells caused by interruption of the blood supply (rare).  Excessive bleeding in the wrist and hand at the dislocation site, causing pressure and injury to nerves and blood vessels (rare).  Writ stiffness.  Recurrent dislocations.  Unstable or arthritic joint following repeated injury or delayed treatment. TREATMENT A lunate dislocation requires immediate realigning of the joint to be done by a trained individual. After reduction the use of ice and medication may help reduce pain and inflammation. Surgery may be necessary to reduce the joint or if other complications are present. After reduction or surgery, the wrist must be immobilized to allow for healing. Strengthening and stretching exercises may be recommended after immobilization to regain strength and a full range of motion. These exercises may be done at home or with a therapist. If the dislocation causes nerves in the wrist to be compressed, then urgent surgery is required. MEDICATION   If pain medication is necessary, then nonsteroidal anti-inflammatory medications, such as aspirin and ibuprofen, or other minor pain relievers, such as acetaminophen, are often recommended.  Do not take pain medication for 7 days before surgery.  Prescription pain relievers may be given by your caregiver. Use only as directed and only as much as you need. HEAT AND COLD  Cold treatment (icing)  relieves pain and reduces inflammation. Cold treatment should be applied for 10 to 15 minutes every 2 to 3 hours for inflammation and pain and immediately after any activity that aggravates your symptoms. Use ice  packs or massage the area with a piece of ice (ice massage).  Heat treatment may be used prior to performing the stretching and strengthening activities prescribed by your caregiver, physical therapist, or athletic trainer. Use a heat pack or soak your injury in warm water. SEEK MEDICAL CARE IF:   Pain, tenderness, or swelling worsens despite treatment.  You experience pain, numbness, or coldness in the hand.  Blue, gray, or dark color appears in the fingernails.  Any of the following occur after surgery:  Increased pain, swelling, redness, drainage or bleeding in the surgical area.  Signs of infection (headache, muscle aches, dizziness, or a general ill feeling with fever).  New, unexplained symptoms develop (drugs used in treatment may produce side effects). Document Released: 11/17/2005 Document Revised: 02/09/2012 Document Reviewed: 03/01/2009 [facility] Patient Information 2015 [place], [place]. This information is not intended to replace advice given to you by your health care provider. Make sure you discuss any questions you have with your health care provider.

## 2014-10-19 NOTE — ED Provider Notes (Signed)
CSN: [id number]     Arrival date & time 10/19/14  1807 History   First MD Initiated Contact with Patient 10/19/14 1947     This chart was scribed for non-physician practitioner working with No att. providers found by Arlan OrganAshley Leger, ED Scribe. This patient was seen in room MH04/MH04 and the patient's care was started at 1:20 PM.   Chief Complaint  Patient presents with  . Wrist Pain   Patient is a 33 y.o. female presenting with wrist pain. The history is provided by the patient. No language interpreter was used.  Wrist Pain This is a chronic problem. The current episode started more than 1 week ago. The problem occurs constantly. The problem has been gradually worsening. Pertinent negatives include no chest pain, no abdominal pain, no headaches and no shortness of breath. The symptoms are aggravated by twisting. The symptoms are relieved by position. She has tried a cold compress for the symptoms. The treatment provided mild relief.    HPI Comments: Anna Owens is a 33 y.o. female who presents to the Emergency Department complaining of R wrist pain to the lateral aspect x several months that has recently became constant in last week. Pain is exacerbated when putting weight on the R wrist and attempting to use the R hand frequently. Pain is alleviated with certain movements of the wrist. She also reports intermittent numbness to the 4th and 5th digit of the R hand. She has tried icing the wrist and fingers with mild improvement for symptoms. Ms. Anna Owens denies any repetitive movements with her wrist of arm. Pt reports recent sore throat and fever onset few days that has now resolved. No other associated symptoms at this time. Pt admits to several dislocations and injuries to the R arm and hand throughout childhood and adolescence. No known allergies to medications.  Past Medical History  Diagnosis Date  . Depression     no meds  . Blood transfusion without reported diagnosis 2007    SAB  .  Complication of anesthesia     "difficult waking up"   Past Surgical History  Procedure Laterality Date  . Foot surgery    . Tonsillectomy    . Dilation and curettage of uterus    . Cystectomy     History reviewed. No pertinent family history. History  Substance Use Topics  . Smoking status: Never Smoker   . Smokeless tobacco: Not on file  . Alcohol Use: No   OB History    Gravida Para Term Preterm AB TAB SAB Ectopic Multiple Living   9 3 1 2 6 3 3   3      Review of Systems  Constitutional: Negative for fever, chills, diaphoresis, activity change, appetite change and fatigue.  HENT: Negative for congestion, facial swelling, rhinorrhea and sore throat.   Eyes: Negative for photophobia and discharge.  Respiratory: Negative for cough, chest tightness and shortness of breath.   Cardiovascular: Negative for chest pain, palpitations and leg swelling.  Gastrointestinal: Negative for nausea, vomiting, abdominal pain and diarrhea.  Endocrine: Negative for polydipsia and polyuria.  Genitourinary: Negative for dysuria, frequency, difficulty urinating and pelvic pain.  Musculoskeletal: Negative for back pain, arthralgias, neck pain and neck stiffness.  Skin: Negative for color change and wound.  Allergic/Immunologic: Negative for immunocompromised state.  Neurological: Negative for facial asymmetry, weakness, numbness and headaches.  Hematological: Does not bruise/bleed easily.  Psychiatric/Behavioral: Negative for confusion and agitation.      Allergies  Other  Home Medications  Prior to Admission medications   Medication Sig Start Date End Date Taking? Authorizing Provider  ibuprofen (ADVIL,MOTRIN) 600 MG tablet Take 1 tablet (600 mg total) by mouth every 6 (six) hours as needed for pain. 07/19/13   Deirdre Colin Mulders Poe, CNM  meloxicam (MOBIC) 7.5 MG tablet Take 1 tablet (7.5 mg total) by mouth daily. 10/19/14   Toy CookeyMegan Aidynn Polendo, MD  prenatal vitamin w/FE, FA (PRENATAL 1 + 1) 27-1 MG  TABS tablet Take 1 tablet by mouth daily. 07/19/13   Danae Orleanseirdre C Poe, CNM   Triage Vitals: BP 117/73 mmHg  Pulse 77  Temp(Src) 98.2 F (36.8 C) (Oral)  Resp 16  Ht 5\' 6"  (1.676 m)  Wt 170 lb (77.111 kg)  BMI 27.45 kg/m2  SpO2 100%  LMP 10/19/2014   Physical Exam  Constitutional: She is oriented to person, place, and time. She appears well-developed and well-nourished. No distress.  HENT:  Head: Normocephalic and atraumatic.  Mouth/Throat: No oropharyngeal exudate.  Eyes: Pupils are equal, round, and reactive to light.  Neck: Normal range of motion. Neck supple.  Cardiovascular: Normal rate, regular rhythm and normal heart sounds.  Exam reveals no gallop and no friction rub.   No murmur heard. Pulmonary/Chest: Effort normal and breath sounds normal. No respiratory distress. She has no wheezes. She has no rales.  Abdominal: Soft. Bowel sounds are normal. She exhibits no distension and no mass. There is no tenderness. There is no rebound and no guarding.  Musculoskeletal: Normal range of motion. She exhibits no edema or tenderness.       Right wrist: She exhibits normal range of motion, no swelling, no effusion and no crepitus.       Arms: No numbness, weakness, dec cap refill  Neurological: She is alert and oriented to person, place, and time.  Skin: Skin is warm and dry.  Psychiatric: She has a normal mood and affect.    ED Course  Procedures (including critical care time)  DIAGNOSTIC STUDIES: Oxygen Saturation is 100% on RA, Normal by my interpretation.    COORDINATION OF CARE: 1:20 PM- Will order DG wrist complete R. Discussed treatment plan with pt at bedside and pt agreed to plan.     Labs Review Labs Reviewed - No data to display  Imaging Review Dg Wrist 2 Views Right  10/19/2014   CLINICAL DATA:  Ulnar sided wrist pain.  No known injury.  EXAM: RIGHT WRIST - 2 VIEW  COMPARISON:  None.  FINDINGS: The joint spaces are maintained. Mild positive ulnar variance. No acute  bony findings or degenerative changes.  IMPRESSION: No acute bony findings.  Mild positive ulnar variance.   Electronically Signed   By: Loralie ChampagneMark  Gallerani M.D.   On: 10/19/2014 20:13     EKG Interpretation None      MDM   Final diagnoses:  Right wrist pain    Pt is a 33 y.o. female with Pmhx as above who presents with several months of R lateral wrist pain, worse for several weeks. NO known injury, but remote hx of repeat wrist dislocations. XR with mild ulnar variance. Will place in wrist splint for comfort and have her f/u with D.r Hudnall. Will start trial of mobic. For pain.       I personally performed the services described in this documentation, which was scribed in my presence. The recorded information has been reviewed and is accurate.    Toy CookeyMegan Charmian Forbis, MD 10/20/14 1320

## 2014-10-19 NOTE — ED Notes (Signed)
Pt states that wrist has been hurting for several weeks but in the last few days she has been having "shocks" go up her arm.  Pt doesn't remember injuring her wrist.

## 2014-10-25 ENCOUNTER — Ambulatory Visit (INDEPENDENT_AMBULATORY_CARE_PROVIDER_SITE_OTHER): Payer: Medicaid Other | Admitting: Family Medicine

## 2014-10-25 ENCOUNTER — Encounter: Payer: Self-pay | Admitting: Family Medicine

## 2014-10-25 VITALS — BP 121/81 | HR 97 | Ht 66.0 in | Wt 170.0 lb

## 2014-10-25 DIAGNOSIS — M25531 Pain in right wrist: Secondary | ICD-10-CM

## 2014-10-25 NOTE — Patient Instructions (Signed)
I'm concerned you have a tear in your TFCC (cartilage of the wrist). You also have irritation of the ulnar nerve as it passes here. Treatment is conservative. Wear wrist brace as often as possible (even when sleeping if you can). Mobic daily - ok to switch to aleve 2 tabs twice a day with food when you are out of the mobic. Follow up with me in 6 weeks. If not improving would consider an MRI arthrogram as we discussed.

## 2014-10-30 DIAGNOSIS — M25531 Pain in right wrist: Secondary | ICD-10-CM | POA: Insufficient documentation

## 2014-10-30 NOTE — Progress Notes (Signed)
PCP: No primary care provider on file.  Subjective:   HPI: Patient is a 33 y.o. female here for right wrist pain.  Patient reports she's had right wrist pain for about 2 months now. She sustained multiple injuries when she was younger in gymnastics and martial arts - told it 'popped out of place' multiple times but never required surgery. Currently has pain ulnar side of wrist with radiation into 4th and 5th digits with numbness. Difficulty rotating (pronation/supination). Has been using wrist brace, elevating, taking mobic recently. Some slight swelling.  Past Medical History  Diagnosis Date  . Depression     no meds  . Blood transfusion without reported diagnosis 2007    SAB  . Complication of anesthesia     "difficult waking up"    Current Outpatient Prescriptions on File Prior to Visit  Medication Sig Dispense Refill  . ibuprofen (ADVIL,MOTRIN) 600 MG tablet Take 1 tablet (600 mg total) by mouth every 6 (six) hours as needed for pain. 30 tablet 1  . meloxicam (MOBIC) 7.5 MG tablet Take 1 tablet (7.5 mg total) by mouth daily. 30 tablet 0  . prenatal vitamin w/FE, FA (PRENATAL 1 + 1) 27-1 MG TABS tablet Take 1 tablet by mouth daily. 30 each 0   No current facility-administered medications on file prior to visit.    Past Surgical History  Procedure Laterality Date  . Foot surgery    . Tonsillectomy    . Dilation and curettage of uterus    . Cystectomy      Allergies  Allergen Reactions  . Other Anaphylaxis    Tree nuts    History   Social History  . Marital Status: Single    Spouse Name: N/A    Number of Children: N/A  . Years of Education: N/A   Occupational History  . Not on file.   Social History Main Topics  . Smoking status: Current Every Day Smoker    Types: Cigarettes  . Smokeless tobacco: Not on file     Comment: 4 cigarettes per day  . Alcohol Use: No  . Drug Use: No  . Sexual Activity: Yes   Other Topics Concern  . Not on file   Social  History Narrative    No family history on file.  BP 121/81 mmHg  Pulse 97  Ht 5\' 6"  (1.676 m)  Wt 170 lb (77.111 kg)  BMI 27.45 kg/m2  LMP 10/19/2014  Review of Systems: See HPI above.    Objective:  Physical Exam:  Gen: NAD  Right wrist: No gross deformity, swelling, bruising. TTP dorsal wrist joint, over TFCC.  No other tenderness including snuffbox. FROM with pain on extension, ulnar deviation. NVI distally. Negative tinels at guyons canal, cubital tunnel.    Assessment & Plan:  1. Right wrist pain - concerning for TFCC tear though no new injury.  Also describes ulnar nerve irritation at level of guyon's canal.  Start with wrist brace regularly as well as mobic regularly.  F/u in 6 weeks.  If not improving consider MR arthrogram of wrist.

## 2014-10-30 NOTE — Assessment & Plan Note (Signed)
concerning for TFCC tear though no new injury.  Also describes ulnar nerve irritation at level of guyon's canal.  Start with wrist brace regularly as well as mobic regularly.  F/u in 6 weeks.  If not improving consider MR arthrogram of wrist.

## 2014-12-06 ENCOUNTER — Ambulatory Visit (INDEPENDENT_AMBULATORY_CARE_PROVIDER_SITE_OTHER): Payer: Medicaid Other | Admitting: Family Medicine

## 2014-12-06 ENCOUNTER — Encounter (INDEPENDENT_AMBULATORY_CARE_PROVIDER_SITE_OTHER): Payer: Self-pay

## 2014-12-06 ENCOUNTER — Encounter: Payer: Self-pay | Admitting: Family Medicine

## 2014-12-06 VITALS — BP 132/81 | HR 99 | Ht 66.0 in | Wt 160.0 lb

## 2014-12-06 DIAGNOSIS — M25531 Pain in right wrist: Secondary | ICD-10-CM

## 2014-12-06 NOTE — Patient Instructions (Signed)
We will go ahead with an MRI arthrogram of your wrist to assess for a TFCC tear. Continue with current treatment (brace) for now. I typically call you the business day following the MRI to go over results.

## 2014-12-07 NOTE — Progress Notes (Addendum)
PCP: No primary care provider on file.  Subjective:   HPI: Patient is a 34 y.o. female here for right wrist pain.  10/25/14: Patient reports she's had right wrist pain for about 2 months now. She sustained multiple injuries when she was younger in gymnastics and martial arts - told it 'popped out of place' multiple times but never required surgery. Currently has pain ulnar side of wrist with radiation into 4th and 5th digits with numbness. Difficulty rotating (pronation/supination). Has been using wrist brace, elevating, taking mobic recently. Some slight swelling.  12/06/14: Patient reports she feels the same compared to last visit. Wearing brace regularly. Tried meloxicam without much benefit. Is left handed. No swelling, bruising. No clicking. Still gets a little numbness on this side.  Past Medical History  Diagnosis Date  . Depression     no meds  . Blood transfusion without reported diagnosis 2007    SAB  . Complication of anesthesia     "difficult waking up"    Current Outpatient Prescriptions on File Prior to Visit  Medication Sig Dispense Refill  . ibuprofen (ADVIL,MOTRIN) 600 MG tablet Take 1 tablet (600 mg total) by mouth every 6 (six) hours as needed for pain. 30 tablet 1  . meloxicam (MOBIC) 7.5 MG tablet Take 1 tablet (7.5 mg total) by mouth daily. 30 tablet 0  . prenatal vitamin w/FE, FA (PRENATAL 1 + 1) 27-1 MG TABS tablet Take 1 tablet by mouth daily. 30 each 0   No current facility-administered medications on file prior to visit.    Past Surgical History  Procedure Laterality Date  . Foot surgery    . Tonsillectomy    . Dilation and curettage of uterus    . Cystectomy      Allergies  Allergen Reactions  . Other Anaphylaxis    Tree nuts    History   Social History  . Marital Status: Single    Spouse Name: N/A    Number of Children: N/A  . Years of Education: N/A   Occupational History  . Not on file.   Social History Main Topics  .  Smoking status: Current Every Day Smoker    Types: Cigarettes  . Smokeless tobacco: Not on file     Comment: 4 cigarettes per day  . Alcohol Use: No  . Drug Use: No  . Sexual Activity: Yes   Other Topics Concern  . Not on file   Social History Narrative    No family history on file.  BP 132/81 mmHg  Pulse 99  Ht 5\' 6"  (1.676 m)  Wt 160 lb (72.576 kg)  BMI 25.84 kg/m2  Review of Systems: See HPI above.    Objective:  Physical Exam:  Gen: NAD  Right wrist: No gross deformity, swelling, bruising. TTP dorsal wrist joint, over TFCC.  No other tenderness including snuffbox. FROM with pain on extension, ulnar deviation.  No click felt. NVI distally. Negative tinels at guyons canal, cubital tunnel.    Assessment & Plan:  1. Right wrist pain - concerning for TFCC tear though no new injury.  Has tried conservative treatment with bracing, meloxicam without benefit over more than 6 weeks.  Will go ahead with MR arthrogram to assess for TFCC tear.  Will likely need surgery referral if this is confirmed.    Addendum:  MRI reviewed and discussed with patient.  No evidence of TFCC tear or other pathology.  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  Advised I would go ahead with general occupational therapy over the next 6 weeks.  If not improving can consider referral to hand surgery but I do not see anything that would warrant operative intervention and this should improve with OT.

## 2014-12-07 NOTE — Assessment & Plan Note (Signed)
concerning for TFCC tear though no new injury.  Has tried conservative treatment with bracing, meloxicam without benefit over more than 6 weeks.  Will go ahead with MR arthrogram to assess for TFCC tear.  Will likely need surgery referral if this is confirmed.

## 2014-12-11 NOTE — Addendum Note (Signed)
Addended by: Kathi SimpersWISE, Katlynne Mckercher F on: 12/11/2014 01:20 PM   Modules accepted: Orders

## 2014-12-25 ENCOUNTER — Ambulatory Visit
Admission: RE | Admit: 2014-12-25 | Discharge: 2014-12-25 | Disposition: A | Discharge status: Home / Self Care | Payer: Medicaid Other | Source: Ambulatory Visit | Attending: Family Medicine | Admitting: Family Medicine

## 2014-12-25 DIAGNOSIS — M25531 Pain in right wrist: Secondary | ICD-10-CM

## 2014-12-25 MED ORDER — IOHEXOL 180 MG/ML  SOLN
5.0000 mL | Freq: Once | INTRAMUSCULAR | Status: AC | PRN
Start: 2014-12-25 — End: 2014-12-25
  Administered 2014-12-25: 5 mL via INTRA_ARTICULAR

## 2015-04-05 ENCOUNTER — Encounter (HOSPITAL_BASED_OUTPATIENT_CLINIC_OR_DEPARTMENT_OTHER): Payer: Self-pay

## 2015-04-05 ENCOUNTER — Emergency Department (HOSPITAL_BASED_OUTPATIENT_CLINIC_OR_DEPARTMENT_OTHER)
Admission: EM | Admit: 2015-04-05 | Discharge: 2015-04-05 | Disposition: A | Discharge status: Home / Self Care | Payer: Medicaid Other | Attending: Emergency Medicine | Admitting: Emergency Medicine

## 2015-04-05 DIAGNOSIS — Z791 Long term (current) use of non-steroidal anti-inflammatories (NSAID): Secondary | ICD-10-CM | POA: Insufficient documentation

## 2015-04-05 DIAGNOSIS — Z720 Tobacco use: Secondary | ICD-10-CM | POA: Insufficient documentation

## 2015-04-05 DIAGNOSIS — L0291 Cutaneous abscess, unspecified: Secondary | ICD-10-CM

## 2015-04-05 DIAGNOSIS — L02211 Cutaneous abscess of abdominal wall: Secondary | ICD-10-CM | POA: Diagnosis present

## 2015-04-05 DIAGNOSIS — Z8659 Personal history of other mental and behavioral disorders: Secondary | ICD-10-CM | POA: Insufficient documentation

## 2015-04-05 DIAGNOSIS — Z79899 Other long term (current) drug therapy: Secondary | ICD-10-CM | POA: Insufficient documentation

## 2015-04-05 HISTORY — DX: Post-traumatic stress disorder, unspecified: F43.10

## 2015-04-05 MED ORDER — SULFAMETHOXAZOLE-TRIMETHOPRIM 800-160 MG PO TABS: 1.0000 | ORAL_TABLET | Freq: Two times a day (BID) | ORAL | Status: AC

## 2015-04-05 MED ORDER — LIDOCAINE HCL 2 % IJ SOLN
5.0000 mL | Freq: Once | INTRAMUSCULAR | Status: AC
  Administered 2015-04-05: 2 mg
  Filled 2015-04-05: qty 20

## 2015-04-05 NOTE — ED Notes (Signed)
Abscess on upper abdomen "for years" and resurfaced 2 days ago.  Has been treated with PO ABX in the past, however continues to come back.  Denies fever.

## 2015-04-05 NOTE — ED Notes (Signed)
Patient asked to change into a gown.  

## 2015-04-05 NOTE — Discharge Instructions (Signed)

## 2015-04-05 NOTE — ED Provider Notes (Signed)
CSN: [id number]     Arrival date & time 04/05/15  0715 History   First MD Initiated Contact with Patient 04/05/15 [phone]     No chief complaint on file.    (Consider location/radiation/quality/duration/timing/severity/associated sxs/prior Treatment) HPI Comments: Patient presented with complaints of an abscess. Patient reports that she has had an area on her upper abdomen that intermittently has gotten infected for more than a year. She reports that she has been put on antibiotics for this in the past and it seems to get better but never completely resolves. She reports that it is more swollen and painful now than it has ever been. No drainage. No fever.   Past Medical History  Diagnosis Date  . Depression     no meds  . Blood transfusion without reported diagnosis 2007    SAB  . Complication of anesthesia     "difficult waking up"   Past Surgical History  Procedure Laterality Date  . Foot surgery    . Tonsillectomy    . Dilation and curettage of uterus    . Cystectomy     No family history on file. History  Substance Use Topics  . Smoking status: Current Every Day Smoker    Types: Cigarettes  . Smokeless tobacco: Not on file     Comment: 4 cigarettes per day  . Alcohol Use: No   OB History    Gravida Para Term Preterm AB TAB SAB Ectopic Multiple Living   9 3 1 2 6 3 3   3      Review of Systems  Skin: Positive for color change.  All other systems reviewed and are negative.     Allergies  Other  Home Medications   Prior to Admission medications   Medication Sig Start Date End Date Taking? Authorizing Provider  ibuprofen (ADVIL,MOTRIN) 600 MG tablet Take 1 tablet (600 mg total) by mouth every 6 (six) hours as needed for pain. 07/19/13   Deirdre Colin Mulders Poe, CNM  meloxicam (MOBIC) 7.5 MG tablet Take 1 tablet (7.5 mg total) by mouth daily. 10/19/14   Toy CookeyMegan Docherty, MD  prenatal vitamin w/FE, FA (PRENATAL 1 + 1) 27-1 MG TABS tablet Take 1 tablet by mouth daily. 07/19/13    Deirdre C Poe, CNM   BP 122/86 mmHg  Pulse 84  Temp(Src) 98.2 F (36.8 C) (Oral)  Resp 16  Ht 5\' 6"  (1.676 m)  Wt 180 lb (81.647 kg)  BMI 29.07 kg/m2  SpO2 100% Physical Exam  Constitutional: She is oriented to person, place, and time. She appears well-developed and well-nourished. No distress.  HENT:  Head: Normocephalic and atraumatic.  Right Ear: Hearing normal.  Left Ear: Hearing normal.  Nose: Nose normal.  Mouth/Throat: Oropharynx is clear and moist and mucous membranes are normal.  Eyes: Conjunctivae and EOM are normal. Pupils are equal, round, and reactive to light.  Neck: Normal range of motion. Neck supple.  Cardiovascular: Regular rhythm, S1 normal and S2 normal.  Exam reveals no gallop and no friction rub.   No murmur heard. Pulmonary/Chest: Effort normal and breath sounds normal. No respiratory distress. She exhibits no tenderness.  Abdominal: Soft. Normal appearance and bowel sounds are normal. There is no hepatosplenomegaly. There is no tenderness. There is no rebound, no guarding, no tenderness at McBurney's point and negative Murphy's sign. No hernia.  Musculoskeletal: Normal range of motion.  Neurological: She is alert and oriented to person, place, and time. She has normal strength. No cranial nerve deficit or  sensory deficit. Coordination normal. GCS eye subscore is 4. GCS verbal subscore is 5. GCS motor subscore is 6.  Skin: Skin is warm, dry and intact. Lesion noted. No cyanosis.     Psychiatric: She has a normal mood and affect. Her speech is normal and behavior is normal. Thought content normal.  Nursing note and vitals reviewed.   ED Course  Procedures (including critical care time)  INCISION AND DRAINAGE Performed by: Gilda CreasePOLLINA, CHRISTOPHER J. Consent: Verbal consent obtained. Risks and benefits: risks, benefits and alternatives were discussed Type: abscess  Body area: abdomen  Anesthesia: local infiltration  Incision was made with a  scalpel.  Local anesthetic: lidocaine 2% w/o epinephrine  Anesthetic total: 1 ml  Complexity: complex Blunt dissection to break up loculations  Drainage: purulent  Drainage amount: small  Packing material: none  Patient tolerance: Patient tolerated the procedure well with no immediate complications.     Labs Review Labs Reviewed - No data to display  Imaging Review No results found.   EKG Interpretation None      MDM   Final diagnoses:  None  abscess  Patient presented to the ER for evaluation of abscess on her abdomen. She reports that she has had recurrent problems in this region. She did have a 0.5 cm pustule with surrounding erythema and induration. I offered antibiotics for treatment versus incision and drainage, she reports that she has) treated with antibiotics in the past and it never completely goes away. The area was therefore opened up. Small amount of pus and sebum was encountered. No packing was necessary. Patient will be started on Bactrim.    Gilda Creasehristopher J Pollina, MD 04/05/15 [phone]

## 2015-11-23 IMAGING — RF DG FLUORO GUIDE NDL PLC/BX
7 series · 7 of 7 positions shown · non-contrast
Comparison: none

CLINICAL DATA: Ulnar side pain. Assess for triangular
fibrocartilage tear.

[Series 1: (hospital) · 1 of 1 slices shown (1 of 7)]
[im 1/1]
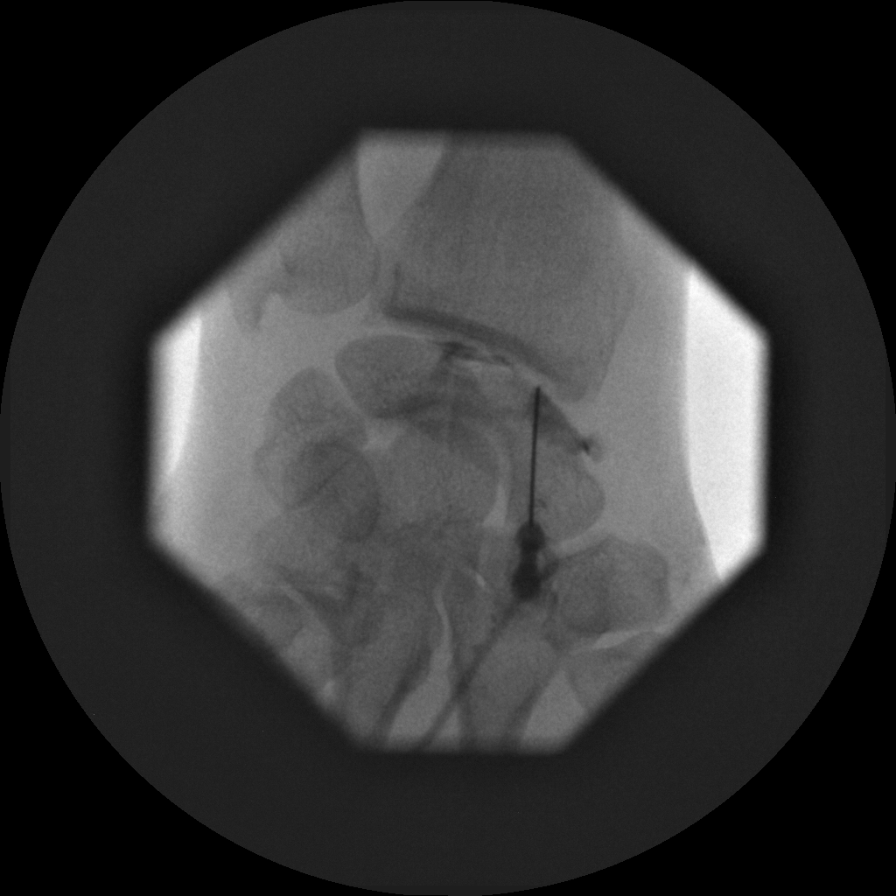

[Series 2: (hospital) · 1 of 1 slices shown (2 of 7)]
[im 1/1]
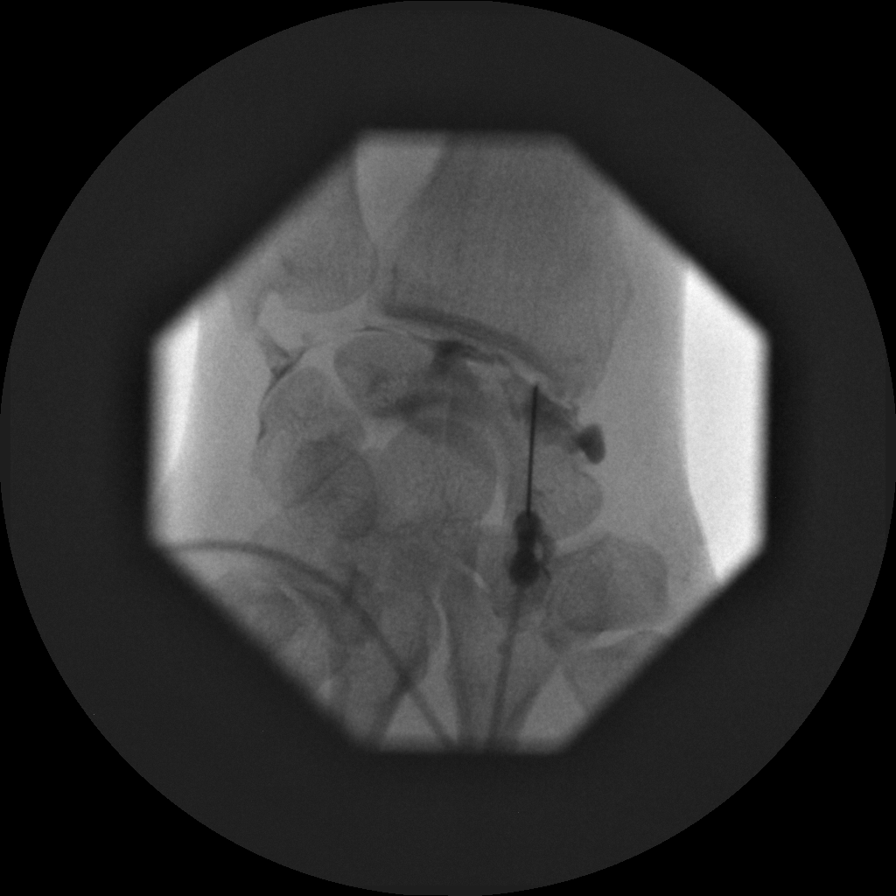

[Series 3: (hospital) · 1 of 1 slices shown (3 of 7)]
[im 1/1]
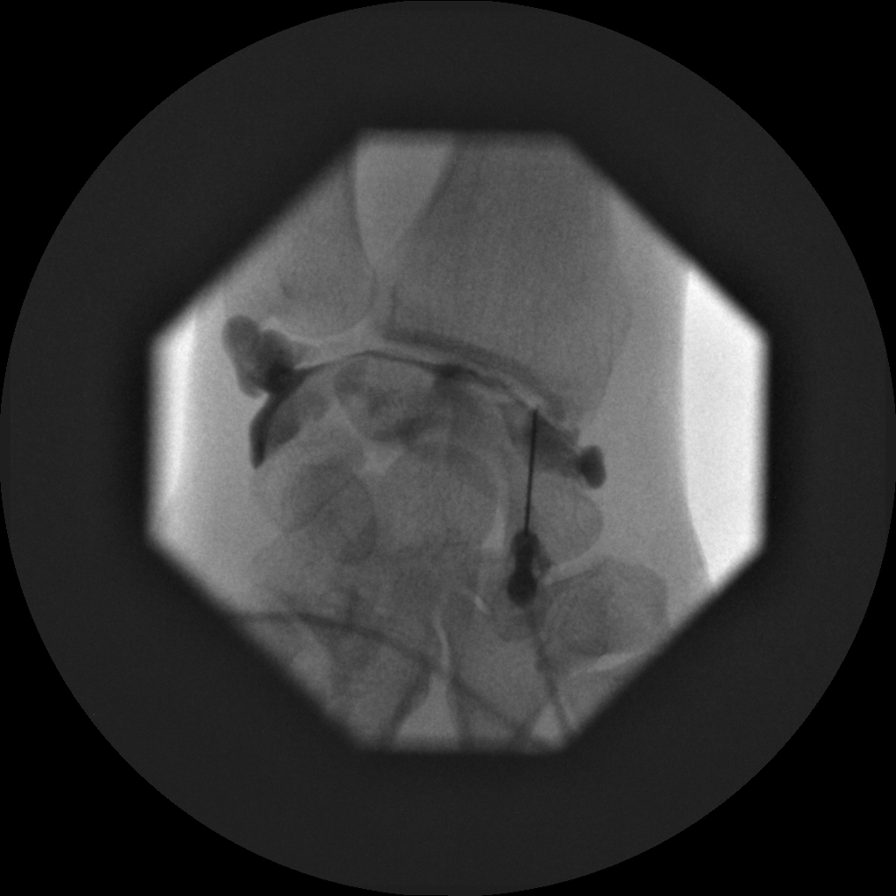

[Series 4: (hospital) · 1 of 1 slices shown (4 of 7)]
[im 1/1]
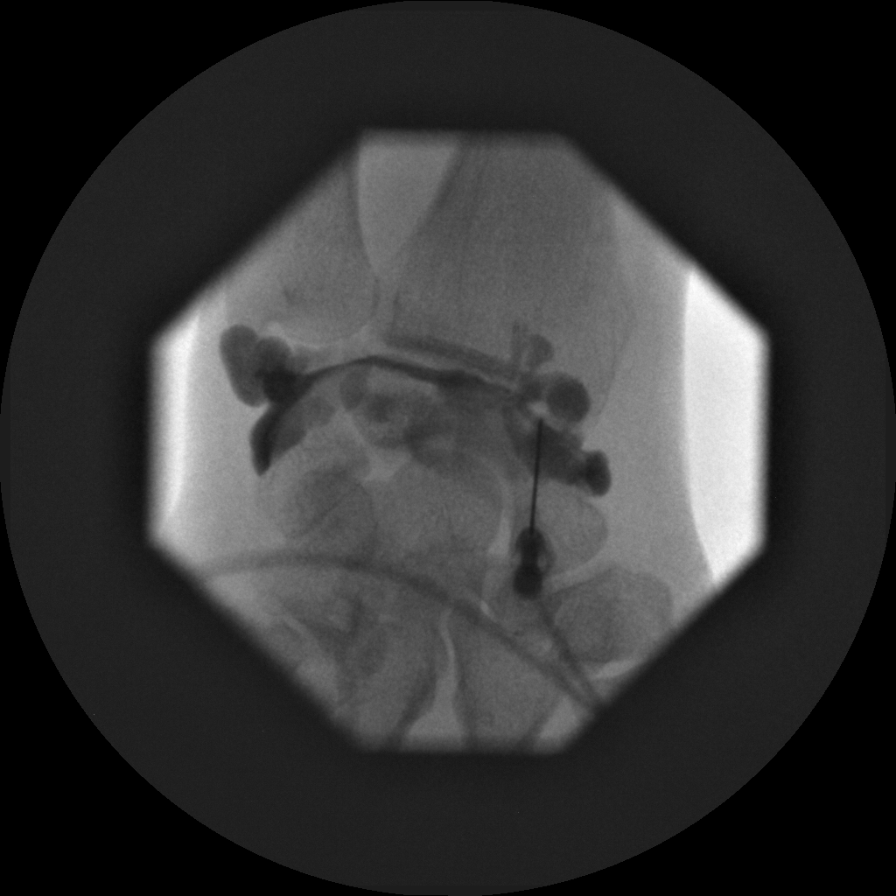

[Series 5: (hospital) · 1 of 1 slices shown (5 of 7)]
[im 1/1]
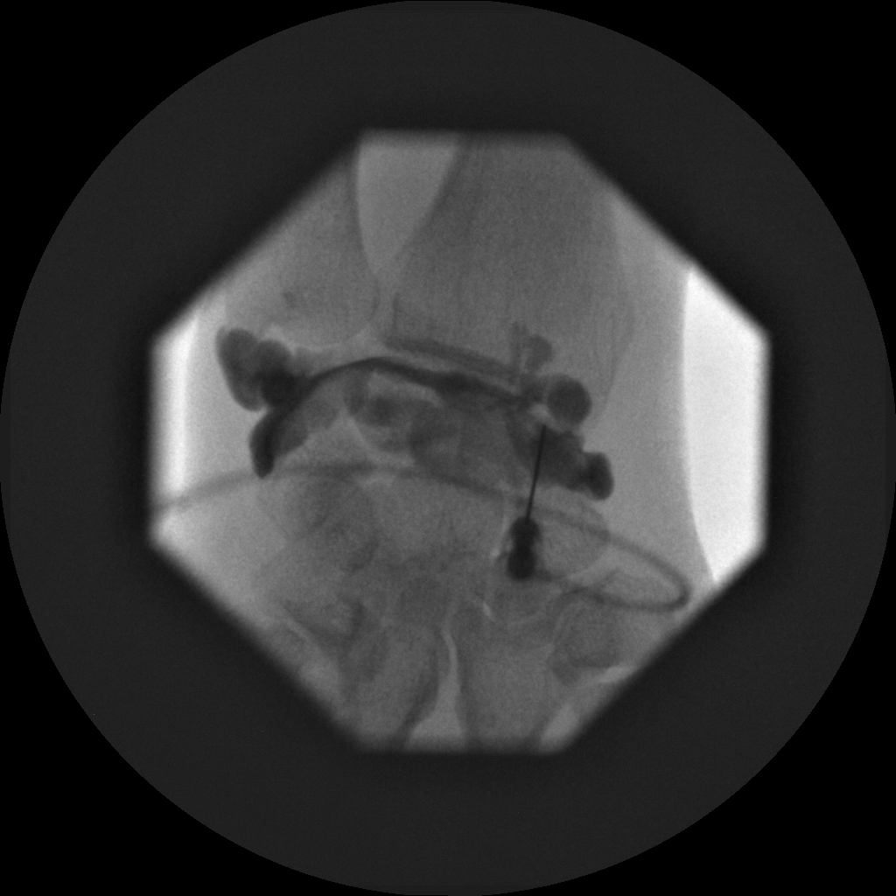

[Series 6: (hospital) · 1 of 1 slices shown (6 of 7)]
[im 1/1]
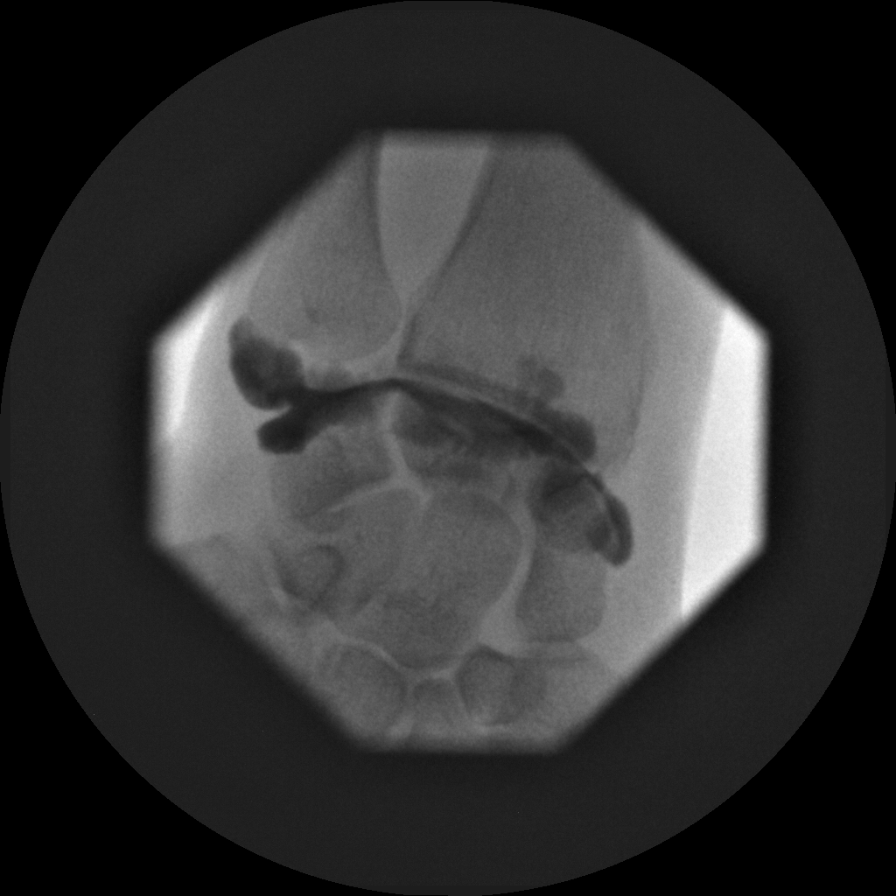

[Series 7: (hospital) · 1 of 1 slices shown (7 of 7)]
[im 1/1]
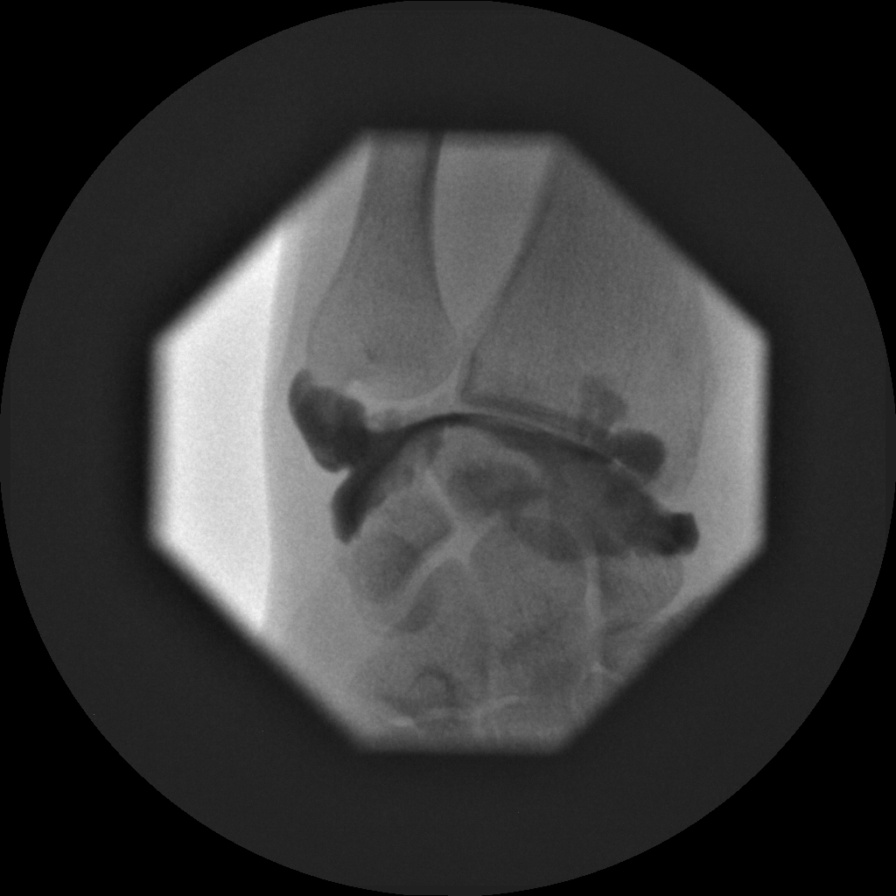

[7 of 7 positions shown; findings below may reference images not displayed]

FLUOROSCOPY TIME:  0 min 50 seconds.

PROCEDURE:
Right RADIOCARPAL JOINT INJECTION UNDER FLUOROSCOPY

The skin dorsal to the wrist was scrubbed with Betadine and drapd in
sterile fashion. Skin anesthesia was carried out using!% Lidocaine.
A 25 Gacis needle was directed into the radiocarpal joint under
flouroscopic guidance. 3cc of a mixture of 0.1 ml Multihance and 10
ml of dilute Omnipaque 180 was then used to fill the radiocarpal
joint. Filming showed contrast remaining within the radiocarpal
joint without visible triangular fibrocartilage tear or proximal row
intercarpal ligament tear..
IMPRESSION: Technically successful right radiocarpal joint injection for MRI.

## 2018-09-30 ENCOUNTER — Other Ambulatory Visit: Payer: Self-pay

## 2018-09-30 ENCOUNTER — Emergency Department (HOSPITAL_BASED_OUTPATIENT_CLINIC_OR_DEPARTMENT_OTHER): Payer: No Typology Code available for payment source

## 2018-09-30 ENCOUNTER — Encounter (HOSPITAL_BASED_OUTPATIENT_CLINIC_OR_DEPARTMENT_OTHER): Payer: Self-pay | Admitting: Emergency Medicine

## 2018-09-30 ENCOUNTER — Emergency Department (HOSPITAL_BASED_OUTPATIENT_CLINIC_OR_DEPARTMENT_OTHER)
Admission: EM | Admit: 2018-09-30 | Discharge: 2018-09-30 | Disposition: A | Discharge status: Home / Self Care | Payer: No Typology Code available for payment source | Attending: Emergency Medicine | Admitting: Emergency Medicine

## 2018-09-30 DIAGNOSIS — M546 Pain in thoracic spine: Secondary | ICD-10-CM | POA: Insufficient documentation

## 2018-09-30 DIAGNOSIS — F17210 Nicotine dependence, cigarettes, uncomplicated: Secondary | ICD-10-CM | POA: Diagnosis not present

## 2018-09-30 DIAGNOSIS — F329 Major depressive disorder, single episode, unspecified: Secondary | ICD-10-CM | POA: Insufficient documentation

## 2018-09-30 MED ORDER — IBUPROFEN 400 MG PO TABS
600.0000 mg | ORAL_TABLET | Freq: Once | ORAL | Status: AC
  Administered 2018-09-30: 600 mg via ORAL
  Filled 2018-09-30: qty 1

## 2018-09-30 MED ORDER — IBUPROFEN 600 MG PO TABS: 600.0000 mg | ORAL_TABLET | Freq: Four times a day (QID) | ORAL | 0 refills | Status: DC | PRN

## 2018-09-30 MED ORDER — METHOCARBAMOL 500 MG PO TABS: 500.0000 mg | ORAL_TABLET | Freq: Two times a day (BID) | ORAL | 0 refills | Status: DC

## 2018-09-30 MED ORDER — METHOCARBAMOL 500 MG PO TABS
500.0000 mg | ORAL_TABLET | Freq: Once | ORAL | Status: AC
  Administered 2018-09-30: 500 mg via ORAL
  Filled 2018-09-30: qty 1
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
## 2018-09-30 NOTE — ED Notes (Signed)
Patient transported to X-ray 
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
## 2018-09-30 NOTE — Discharge Instructions (Addendum)

## 2019-06-24 ENCOUNTER — Emergency Department (HOSPITAL_COMMUNITY): Payer: No Typology Code available for payment source

## 2019-06-24 ENCOUNTER — Encounter (HOSPITAL_BASED_OUTPATIENT_CLINIC_OR_DEPARTMENT_OTHER): Payer: Self-pay | Admitting: Emergency Medicine

## 2019-06-24 ENCOUNTER — Emergency Department (HOSPITAL_BASED_OUTPATIENT_CLINIC_OR_DEPARTMENT_OTHER)
Admission: EM | Admit: 2019-06-24 | Discharge: 2019-06-25 | Disposition: A | Discharge status: Home / Self Care | Payer: No Typology Code available for payment source | Attending: Emergency Medicine | Admitting: Emergency Medicine

## 2019-06-24 ENCOUNTER — Other Ambulatory Visit: Payer: Self-pay

## 2019-06-24 DIAGNOSIS — Z20828 Contact with and (suspected) exposure to other viral communicable diseases: Secondary | ICD-10-CM | POA: Diagnosis not present

## 2019-06-24 DIAGNOSIS — F17210 Nicotine dependence, cigarettes, uncomplicated: Secondary | ICD-10-CM | POA: Insufficient documentation

## 2019-06-24 DIAGNOSIS — R531 Weakness: Secondary | ICD-10-CM | POA: Diagnosis not present

## 2019-06-24 DIAGNOSIS — M5416 Radiculopathy, lumbar region: Secondary | ICD-10-CM | POA: Insufficient documentation

## 2019-06-24 DIAGNOSIS — M5116 Intervertebral disc disorders with radiculopathy, lumbar region: Secondary | ICD-10-CM

## 2019-06-24 DIAGNOSIS — R29898 Other symptoms and signs involving the musculoskeletal system: Secondary | ICD-10-CM

## 2019-06-24 DIAGNOSIS — M545 Low back pain: Secondary | ICD-10-CM | POA: Diagnosis present

## 2019-06-24 HISTORY — DX: Dorsalgia, unspecified: M54.9

## 2019-06-24 LAB — SARS CORONAVIRUS 2 BY RT PCR (HOSPITAL ORDER, PERFORMED IN ~~LOC~~ HOSPITAL LAB): SARS Coronavirus 2: NEGATIVE

## 2019-06-24 MED ORDER — OXYCODONE-ACETAMINOPHEN 5-325 MG PO TABS
1.0000 | ORAL_TABLET | Freq: Once | ORAL | Status: AC
  Administered 2019-06-25: 1 via ORAL
  Filled 2019-06-24: qty 1

## 2019-06-24 MED ORDER — PREDNISONE 20 MG PO TABS
60.0000 mg | ORAL_TABLET | Freq: Once | ORAL | Status: AC
  Administered 2019-06-25: 60 mg via ORAL
  Filled 2019-06-24: qty 3

## 2019-06-24 MED ORDER — HYDROMORPHONE HCL 1 MG/ML IJ SOLN
1.0000 mg | Freq: Once | INTRAMUSCULAR | Status: AC
  Administered 2019-06-24: 1 mg via INTRAMUSCULAR
  Filled 2019-06-24: qty 1

## 2019-06-24 MED ORDER — KETOROLAC TROMETHAMINE 60 MG/2ML IM SOLN
60.0000 mg | Freq: Once | INTRAMUSCULAR | Status: AC
Start: 2019-06-24 — End: 2019-06-24
  Administered 2019-06-24: 60 mg via INTRAMUSCULAR
  Filled 2019-06-24: qty 2

## 2019-06-24 NOTE — ED Provider Notes (Signed)
[facility] EMERGENCY DEPARTMENT Provider Note   CSN: [id number] Arrival date & time: 06/24/19  1704     History   Chief Complaint Chief Complaint  Patient presents with  . Back Pain    HPI Anna Owens is a 38 y.o. female.     HPI Patient fell 1 week ago.  She was in her bathroom brushing her teeth and ended up falling in heard a "crack" in her lower back.  She had severe pain and reports it was even hard for her to walk at that time.  She was seen in the [facility] and reports x-rays were done.  Reportedly nothing abnormal was identified.  She reports she has been taking Robaxin but getting no pain relief.  She reports that she is required assistance around the home just to walk and get to the bathroom.  She reports that she did not have help she would be crawling.  She reports that the right leg has severe pain that shoots down it and it gives out under her.  She denies bowel or bladder dysfunction.  She denies abdominal pain.  She denies fevers or chills.  She reports she has had some disc problems before but never required surgery.  She reports she was told at the [facility] if it is getting worse she should come to the emergency department. Past Medical History:  Diagnosis Date  . Back pain   . Blood transfusion without reported diagnosis 2007   SAB  . Complication of anesthesia    "difficult waking up"  . Depression    no meds  . PTSD (post-traumatic stress disorder)     Patient Active Problem List   Diagnosis Date Noted  . Right wrist pain 10/30/2014    Past Surgical History:  Procedure Laterality Date  . CYSTECTOMY    . DILATION AND CURETTAGE OF UTERUS    . FOOT SURGERY    . TONSILLECTOMY    . TUBAL LIGATION       OB History    Gravida  9   Para  3   Term  1   Preterm  2   AB  6   Living  3     SAB  3   TAB  3   Ectopic      Multiple      Live Births  3            Home Medications    Prior to Admission medications   Not on  File    Family History No family history on file.  Social History Social History   Tobacco Use  . Smoking status: Current Every Day Smoker    Packs/day: 0.50    Types: Cigarettes  . Smokeless tobacco: Never Used  Substance Use Topics  . Alcohol use: Yes    Comment: occ  . Drug use: Yes    Types: Marijuana    Comment: occ     Allergies   Other   Review of Systems Review of Systems 10 Systems reviewed and are negative for acute change except as noted in the HPI.   Physical Exam Updated Vital Signs BP 119/86 (BP Location: Left Arm)   Pulse (!) 104   Temp 98.3 F (36.8 C) (Oral)   Resp 20   Ht 5\' 5"  (1.651 m)   Wt 79.4 kg   LMP 06/17/2019   SpO2 99%   BMI 29.12 kg/m   Physical Exam Constitutional:  Comments: Alert and nontoxic.  Normal mental status.  Appears to be in pain.  HENT:     Head: Normocephalic and atraumatic.  Eyes:     Extraocular Movements: Extraocular movements intact.  Cardiovascular:     Rate and Rhythm: Normal rate and regular rhythm.  Pulmonary:     Effort: Pulmonary effort is normal.     Breath sounds: Normal breath sounds.  Abdominal:     General: There is no distension.     Palpations: Abdomen is soft.     Tenderness: There is no abdominal tenderness. There is no guarding.  Musculoskeletal:     Comments: Patient endorses pain to palpation over the lower back in the midline of the sacrum and to the right.  Worse with trying to sit forward in the stretcher.  No appreciable swelling or skin changes of the lower extremities.  Patient does have decreased dorsiflexion and ability to spontaneously elevate of the right lower extremity relative to the left.  She denies sensation change.  Both feet are warm and dry.  No peripheral edema.  No joint effusions.  Neurological:     Mental Status: She is oriented to person, place, and time.     Cranial Nerves: No cranial nerve deficit.     Motor: Weakness present.     Coordination: Coordination  normal.  Psychiatric:        Mood and Affect: Mood normal.      ED Treatments / Results  Labs (all labs ordered are listed, but only abnormal results are displayed) Labs Reviewed - No data to display  EKG None  Radiology No results found.  Procedures Procedures (including critical care time)  Medications Ordered in ED Medications - No data to display   Initial Impression / Assessment and Plan / ED Course  I have reviewed the triage vital signs and the nursing notes.  Pertinent labs & imaging results that were available during my care of the patient were reviewed by me and considered in my medical decision making (see chart for details).       Patient had fall 1 week ago.  She had x-rays done on outpatient basis by the [place].  She has been treated with Robaxin.  Patient cites worsening of her pain and now weakness of the right leg to the point that it is giving out when she tries to walk and is requiring assistance.  Objectively on exam she does have weaker dorsiflexion and plantar flexion on the right compared to the left.  Patient was treated for pain in the emergency department with Toradol IM and Dilaudid IM.  She did wish to proceed with MRI and therefore is being transferred by private vehicle with her husband to [facility] for MRI of the lumbar spine.  If results are unremarkable and patient adequately pain controlled, patient management could include steroid taper, continued Robaxin and short course of hydrocodone or Percocet for pain with appropriate follow-up.  Dr. Reather Converse EDP at [facility] accepts for transfer for MRI.  Final Clinical Impressions(s) / ED Diagnoses   Final diagnoses:  Lumbar radiculopathy  Right leg weakness    ED Discharge Orders    None       Charlesetta Shanks, MD 06/24/19 [phone]

## 2019-06-24 NOTE — ED Triage Notes (Signed)
[place] last week with instant pain in lumbar spine, radiating down right leg.  Was seen at the [place] and XRays done. Taking Robaxin but it doesn't help.  [facility] if [facility] is still having pain to go to hospital.  No improvement in pain since the fall.

## 2019-06-24 NOTE — ED Provider Notes (Addendum)
Patient transferred to this ED from [facility] for MRI of the L-spine.  She Dr. Samson Frederic note for full HPI and work-up.  Briefly, patient had a mechanical fall 1 week ago and has been having severe pain to her low back with radiculopathy down the right leg.  Exam by previous provider today revealed weakness of the right lower extremity.  No bowel or bladder incontinence.  She was sent to this ED for MRI of the L-spine.  Care assumed patient was roomed to the spine.  She was evaluated and reported pain was improved initially by the IM dose of Dilaudid and Toradol given prior to arrival to the ED, however the pain medication is slightly wearing off.  Will re-dose the IM Dilaudid pending MRI.  Results for orders placed or performed during the hospital encounter of 06/24/19  SARS Coronavirus 2 (CEPHEID - Performed in [place] hospital lab), [facility] Order   Specimen: Nasopharyngeal Swab  Result Value Ref Range   SARS Coronavirus 2 NEGATIVE NEGATIVE   Mr Lumbar Spine Wo Contrast  Result Date: 06/24/2019 CLINICAL DATA:  Back pain radiating to the right lower extremity EXAM: MRI LUMBAR SPINE WITHOUT CONTRAST TECHNIQUE: Multiplanar, multisequence MR imaging of the lumbar spine was performed. No intravenous contrast was administered. COMPARISON:  None. FINDINGS: Segmentation:  Normal Alignment:  Normal Vertebrae:  Normal marrow signal Conus medullaris and cauda equina: Conus extends to the L1 level. Conus and cauda equina appear normal. Paraspinal and other soft tissues: The visualized vascular, retroperitoneal and paraspinal structures are normal. Disc levels: T12-L1: Normal. L1-2: Normal. L2-3: Normal. L3-4: Normal. L4-5: Normal. L5-S1: There is a large right subarticular disc extrusion with inferior migration that contacts and displaces the right S1 nerve root in the lateral recess. There is mild central spinal canal stenosis. No neural foraminal stenosis. 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 Electronically Signed   By: Ulyses Jarred M.D.   On: 06/24/2019 23:16    Clinical Course as of Jul 25 0001  Fri Jun 24, 2019  2116 Patient transferred for MRI.  Patient reports pain is slowly coming back.  Will re-dose with IM medication.  Pending MRI.   [JR]  [place] with neurosurgery consulted. Recommends not emergent surgery, can follow up as outpatient early this week.   [JR]    Clinical Course User Index [JR] Robinson, [place] N, PA-C    MRI with L5-S1 subarticular disc extrusion migration displacing the descending right S1 nerve root.  This was discussed with NP Arnetha Massy w neurosurgery who reviewed MRI images with Dr. Annette Stable and recommends this is not an emergent surgical complication and patient should be able to be discharged as long as pain is controlled with outpatient follow-up with her early next week.  She she recommends oral steroids, pain medication, NSAIDs and to discuss strict return precautions with patient.  On reevaluation, patient reports improvement in symptoms, however she does feel somewhat loopy from the Dilaudid.  Discussed imaging results and neurosurgery recommendations.  She agrees with this plan. Pt able to ambulate in the ED. Will discharge with short course of p.o. narcotics, steroid burst, muscle relaxant, and encouraged to take NSAIDs every 6 hours.  Strict return precautions discussed including bowel or bladder incontinence, severely worsening pain not controlled by prescription medications provided today, for severely worsening weakness.  Discussed results, findings, treatment and follow up. Patient advised of return precautions. Patient verbalized understanding  and agreed with plan.  [facility] [place] queried    Robinson, [place] N, Cordelia Poche-C 06/25/19 0001    Robinson, [place] N, PA-C 06/25/19 Jorje [id number]    Eber HongMiller, Brian,  MD 06/25/19 1350

## 2019-06-24 NOTE — ED Notes (Signed)
Patient was able to ambulate slowly with assistance due to right sided pain

## 2019-06-25 MED ORDER — OXYCODONE-ACETAMINOPHEN 5-325 MG PO TABS: 1.0000 | ORAL_TABLET | Freq: Four times a day (QID) | ORAL | 0 refills | Status: DC | PRN

## 2019-06-25 MED ORDER — IBUPROFEN 600 MG PO TABS: 600.0000 mg | ORAL_TABLET | Freq: Four times a day (QID) | ORAL | 0 refills | Status: DC | PRN

## 2019-06-25 MED ORDER — PREDNISONE 10 MG PO TABS: 20.0000 mg | ORAL_TABLET | Freq: Every day | ORAL | 0 refills | Status: AC

## 2019-06-25 MED ORDER — METHOCARBAMOL 500 MG PO TABS: 500.0000 mg | ORAL_TABLET | Freq: Two times a day (BID) | ORAL | 0 refills | Status: DC

## 2019-06-25 NOTE — Discharge Instructions (Signed)
Please read instructions below. Apply ice to your areas of pain for 20 minutes at a time. Take 600 mg of Advil/ibuprofen every 6 hours for pain. You can take 1-2 tabs of oxycodone every 6 hours as needed for severe pain. Be aware there is tylenol in this medication, do not take tylenol with it. This medication may make you drowsy, do not drive while taking. You can take robaxin every 12 hours as needed for muscle spasm. Be aware this medication can make you drowsy. Schedule an appointment with the neurosurgeon for further management. Return to the ER for severely worsening pain not relieved by the prescribed medications, new significant weakness in your legs, inability to urinate, inability to hold your bowels.

## 2022-10-01 ENCOUNTER — Encounter (HOSPITAL_BASED_OUTPATIENT_CLINIC_OR_DEPARTMENT_OTHER): Payer: Self-pay

## 2022-10-01 ENCOUNTER — Other Ambulatory Visit: Payer: Self-pay

## 2022-10-01 ENCOUNTER — Emergency Department (HOSPITAL_BASED_OUTPATIENT_CLINIC_OR_DEPARTMENT_OTHER)
Admission: EM | Admit: 2022-10-01 | Discharge: 2022-10-01 | Disposition: A | Discharge status: Home / Self Care | Payer: No Typology Code available for payment source | Attending: Emergency Medicine | Admitting: Emergency Medicine

## 2022-10-01 ENCOUNTER — Emergency Department (HOSPITAL_BASED_OUTPATIENT_CLINIC_OR_DEPARTMENT_OTHER): Payer: No Typology Code available for payment source

## 2022-10-01 DIAGNOSIS — R062 Wheezing: Secondary | ICD-10-CM

## 2022-10-01 DIAGNOSIS — Z91040 Latex allergy status: Secondary | ICD-10-CM | POA: Diagnosis not present

## 2022-10-01 DIAGNOSIS — Z20822 Contact with and (suspected) exposure to covid-19: Secondary | ICD-10-CM | POA: Diagnosis not present

## 2022-10-01 DIAGNOSIS — J40 Bronchitis, not specified as acute or chronic: Secondary | ICD-10-CM | POA: Diagnosis not present

## 2022-10-01 DIAGNOSIS — R059 Cough, unspecified: Secondary | ICD-10-CM | POA: Diagnosis present

## 2022-10-01 LAB — RESP PANEL BY RT-PCR (FLU A&B, COVID) ARPGX2
Influenza A by PCR: NEGATIVE
Influenza B by PCR: NEGATIVE
SARS Coronavirus 2 by RT PCR: NEGATIVE

## 2022-10-01 MED ORDER — BENZONATATE 100 MG PO CAPS: 100.0000 mg | ORAL_CAPSULE | Freq: Three times a day (TID) | ORAL | 0 refills | Status: AC

## 2022-10-01 MED ORDER — PREDNISONE 50 MG PO TABS: 50.0000 mg | ORAL_TABLET | Freq: Every day | ORAL | 0 refills | Status: AC

## 2022-10-01 MED ORDER — ALBUTEROL SULFATE HFA 108 (90 BASE) MCG/ACT IN AERS: 1.0000 | INHALATION_SPRAY | Freq: Four times a day (QID) | RESPIRATORY_TRACT | 0 refills | Status: AC | PRN

## 2022-10-01 MED ORDER — PREDNISONE 50 MG PO TABS
60.0000 mg | ORAL_TABLET | Freq: Once | ORAL | Status: AC
  Administered 2022-10-01: 60 mg via ORAL
  Filled 2022-10-01: qty 1

## 2022-10-01 MED ORDER — IPRATROPIUM-ALBUTEROL 0.5-2.5 (3) MG/3ML IN SOLN
3.0000 mL | Freq: Once | RESPIRATORY_TRACT | Status: AC
  Administered 2022-10-01: 3 mL via RESPIRATORY_TRACT
  Filled 2022-10-01: qty 3

## 2022-10-01 NOTE — ED Provider Notes (Signed)
[facility] EMERGENCY DEPARTMENT Provider Note   CSN: [id number] Arrival date & time: 10/01/22  1212     History  Chief Complaint  Patient presents with   Cough   Shortness of Breath   Nasal Congestion    Anna Owens is a 41 y.o. female hx of SOB and cough since yesterday. Chest pain with coughing. Nonexertional, non pleuritic in nature. Associated congestion and rhinorrhea. Hx of bronchitis, feels similar. No LE pain, swelling. No hx of PE, VTE. No recent surgery, immobilization. No fever.  HPI     Home Medications Prior to Admission medications   Medication Sig Start Date End Date Taking? Authorizing Provider  albuterol (VENTOLIN HFA) 108 (90 Base) MCG/ACT inhaler Inhale 1-2 puffs into the lungs every 6 (six) hours as needed for wheezing or shortness of breath. 10/01/22  Yes Yahira Timberman A, PA-C  benzonatate (TESSALON) 100 MG capsule Take 1 capsule (100 mg total) by mouth every 8 (eight) hours. 10/01/22  Yes Banks Chaikin A, PA-C  calcium-vitamin D (OSCAL WITH D) 500-5 MG-MCG tablet Take 1 tablet by mouth.   Yes [provider]  cetirizine (ZYRTEC) 10 MG tablet Take 10 mg by mouth daily.   Yes [provider]  diphenhydrAMINE (BENADRYL) 50 MG tablet Take 50 mg by mouth at bedtime as needed for itching.   Yes [provider]  DULoxetine (CYMBALTA) 20 MG capsule Take 20 mg by mouth 2 (two) times daily.   Yes [provider]  predniSONE (DELTASONE) 50 MG tablet Take 1 tablet (50 mg total) by mouth daily for 4 days. 10/01/22 10/05/22 Yes Tammey Deeg A, PA-C  pseudoephedrine (SUDAFED) 30 MG tablet Take 30 mg by mouth every 4 (four) hours as needed for congestion.   Yes [provider]      Allergies    Other and Latex    Review of Systems   Review of Systems  Constitutional:  Positive for chills and fatigue.  HENT:  Positive for congestion, postnasal drip, rhinorrhea and sneezing. Negative for sore throat,  tinnitus, trouble swallowing and voice change.   Respiratory:  Positive for cough, shortness of breath and wheezing.   Cardiovascular:  Positive for chest pain (with coughing).  Gastrointestinal: Negative.   Genitourinary: Negative.   Musculoskeletal: Negative.   Skin: Negative.   Neurological: Negative.   All other systems reviewed and are negative.   Physical Exam Updated Vital Signs BP 111/76   Pulse 88   Temp 98.7 F (37.1 C) (Oral)   Resp 17   Ht 5\' 5"  (1.651 m)   Wt 94.3 kg   SpO2 100%   BMI 34.61 kg/m  Physical Exam Vitals and nursing note reviewed.  Constitutional:      General: She is not in acute distress.    Appearance: She is well-developed. She is not ill-appearing, toxic-appearing or diaphoretic.  HENT:     Head: Normocephalic and atraumatic.     Mouth/Throat:     Mouth: Mucous membranes are moist.  Eyes:     Pupils: Pupils are equal, round, and reactive to light.  Cardiovascular:     Rate and Rhythm: Normal rate.     Pulses: Normal pulses.     Heart sounds: Normal heart sounds.  Pulmonary:     Effort: Pulmonary effort is normal. No respiratory distress.     Breath sounds: Wheezing present.  Chest:     Chest wall: No mass, deformity, tenderness or crepitus.  Abdominal:  General: Bowel sounds are normal. There is no distension.     Palpations: Abdomen is soft. There is no hepatomegaly, splenomegaly or mass.     Tenderness: There is no abdominal tenderness. There is no guarding or rebound.  Musculoskeletal:        General: Normal range of motion.     Cervical back: Normal range of motion.     Right lower leg: No tenderness. No edema.     Left lower leg: No tenderness. No edema.  Skin:    General: Skin is warm and dry.     Capillary Refill: Capillary refill takes less than 2 seconds.  Neurological:     General: No focal deficit present.     Mental Status: She is alert and oriented to person, place, and time.  Psychiatric:        Mood and  Affect: Mood normal.     ED Results / Procedures / Treatments   Labs (all labs ordered are listed, but only abnormal results are displayed) Labs Reviewed  RESP PANEL BY RT-PCR (FLU A&B, COVID) [id number]    EKG None  Radiology DG Chest 2 View  Result Date: 10/01/2022 CLINICAL DATA:  tachypnea, chest congestion, strong congested cough EXAM: CHEST - 2 VIEW COMPARISON:  Chest x-ray February 29, 2012. FINDINGS: The heart size and mediastinal contours are within normal limits. Both lungs are clear. No visible pleural effusions or pneumothorax. No acute osseous abnormality. IMPRESSION: No active cardiopulmonary disease. Electronically Signed   By: Feliberto Harts M.D.   On: 10/01/2022 13:23    Procedures Procedures    Medications Ordered in ED Medications  ipratropium-albuterol (DUONEB) 0.5-2.5 (3) MG/3ML nebulizer solution 3 mL (3 mLs Nebulization Given 10/01/22 1351)  predniSONE (DELTASONE) tablet 60 mg (60 mg Oral Given 10/01/22 1348)    ED Course/ Medical Decision Making/ A&P    41 year old here for evaluation of congestion, rhinorrhea, cough and shortness of breath.  Began yesterday.  Has some chest pain when she coughs however otherwise has no pain.  Nonexertional, nonpleuritic in nature.  No history of PE, VTE.  No lower extremity swelling, pain.  No recent surgery, mobilization or malignancy.  Some wheeze on exam.  History of bronchitis, states feels similar.  No sick contacts.  Labs and imaging personally viewed and interpreted:  EKG without ischemic changes, not crossing over into epic Chest x-ray without pneumonia, edema, cardiomegaly, pneumothorax COVID, flu negative  Patient received steroids, nebulizer treatment.  Reassessed after above treatments.  She feels significantly improved.  Her wheeze has resolved.  I suspect she likely has viral URI with cough or bronchitic nature.  Will DC home with symptomatic management.  At this time I will suspicion for acute ACS, PE,  dissection, bacterial pneumonia, pneumothorax, pulmonary edema.  The patient has been appropriately medically screened and/or stabilized in the ED. I have low suspicion for any other emergent medical condition which would require further screening, evaluation or treatment in the ED or require inpatient management.  Patient is hemodynamically stable and in no acute distress.  Patient able to ambulate in department prior to ED.  Evaluation does not show acute pathology that would require ongoing or additional emergent interventions while in the emergency department or further inpatient treatment.  I have discussed the diagnosis with the patient and answered all questions.  Pain is been managed while in the emergency department and patient has no further complaints prior to discharge.  Patient is comfortable with plan discussed in room  and is stable for discharge at this time.  I have discussed strict return precautions for returning to the emergency department.  Patient was encouraged to follow-up with PCP/specialist refer to at discharge.                           Medical Decision Making Amount and/or Complexity of Data Reviewed Independent Historian:     Details: daughter External Data Reviewed: labs, radiology, ECG and notes. Labs: ordered. Decision-making details documented in ED Course. Radiology: ordered and independent interpretation performed. Decision-making details documented in ED Course. ECG/medicine tests: ordered and independent interpretation performed. Decision-making details documented in ED Course.  Risk Prescription drug management.         Final Clinical Impression(s) / ED Diagnoses Final diagnoses:  Wheeze  Bronchitis    Rx / DC Orders ED Discharge Orders          Ordered    predniSONE (DELTASONE) 50 MG tablet  Daily        10/01/22 1519    albuterol (VENTOLIN HFA) 108 (90 Base) MCG/ACT inhaler  Every 6 hours PRN        10/01/22 1519    benzonatate  (TESSALON) 100 MG capsule  Every 8 hours        10/01/22 1519              Lyndall Bellot A, PA-C 10/01/22 1524    Lajean Saver, MD 10/01/22 1558

## 2022-10-01 NOTE — ED Notes (Signed)
Presents with chest congestion, cough, hoarseness. Feels warm to the touch.

## 2022-10-01 NOTE — ED Triage Notes (Signed)
Presents with SOB, chest congestion, cough with scant sputum, runny nose. Headache yesterday, resolved today. Clear bilateral breath sounds.

## 2022-10-01 NOTE — Discharge Instructions (Addendum)
Take medication as prescribed  Return for new or worsening symptoms
